# Patient Record
Sex: Female | Born: 1990 | Race: Black or African American | Hispanic: No | Marital: Single | Smoking: Never smoker
Health system: Southern US, Community
[De-identification: ages and names within clinical notes are randomized; demographics above are authoritative.]

## PROBLEM LIST (undated history)

## (undated) DIAGNOSIS — G932 Benign intracranial hypertension: Secondary | ICD-10-CM

## (undated) DIAGNOSIS — E559 Vitamin D deficiency, unspecified: Secondary | ICD-10-CM

## (undated) DIAGNOSIS — G250 Essential tremor: Secondary | ICD-10-CM

## (undated) DIAGNOSIS — K219 Gastro-esophageal reflux disease without esophagitis: Secondary | ICD-10-CM

## (undated) DIAGNOSIS — IMO0002 Reserved for concepts with insufficient information to code with codable children: Secondary | ICD-10-CM

## (undated) DIAGNOSIS — R42 Dizziness and giddiness: Secondary | ICD-10-CM

## (undated) DIAGNOSIS — J45909 Unspecified asthma, uncomplicated: Secondary | ICD-10-CM

## (undated) DIAGNOSIS — E739 Lactose intolerance, unspecified: Secondary | ICD-10-CM

## (undated) DIAGNOSIS — R251 Tremor, unspecified: Secondary | ICD-10-CM

## (undated) HISTORY — PX: TYMPANOSTOMY TUBE PLACEMENT: SHX32

## (undated) HISTORY — DX: Essential tremor: G25.0

## (undated) HISTORY — PX: TYMPANOPLASTY: SHX33

## (undated) HISTORY — DX: Tremor, unspecified: R25.1

## (undated) HISTORY — DX: Vitamin D deficiency, unspecified: E55.9

## (undated) HISTORY — DX: Benign intracranial hypertension: G93.2

## (undated) HISTORY — DX: Reserved for concepts with insufficient information to code with codable children: IMO0002

## (undated) HISTORY — DX: Dizziness and giddiness: R42

---
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
## 2010-07-17 NOTE — Op Note (Signed)
[place]. [facility]  Patient:    Michele Douglas, Michele Douglas                       MRN: [id number] Proc. Date: 09/22/00 Adm. Date:  09/22/00 Attending:  Molly Maduro L. Lyman Bishop, M.D.                           Operative Report  PREOPERATIVE DIAGNOSIS:  Central perforation left tympanic membrane.  POSTOPERATIVE DIAGNOSIS:  Central perforation left tympanic membrane.  PROCEDURE:  Left type 1 perichondrial cartilage tympanoplasty.  SURGEON:  Dr. Garrison Columbus.  ANESTHESIA:  General.  PROCEDURE IN DETAIL:  This patient has had childhood ear infections with tube myringotomies x 2 and had done well for several years following that with no further ear infections, and intact ear drums and 1 year prior to admission, after dying and swimming ruptured her ear drum on the left side which has been persistent.  Examination showed a large heart shaped type perforation involving mostly the posterior ear drum with no evidence of infection or cholesteatoma.   Audiogram showed a moderate conductive loss in the left ear, normal hearing in the right ear.  After satisfactory general endotracheal anesthesia had been induced 2% Xylocaine containing 1:50,000 epinephrine was infiltrated in the external ear canal and in the posterior external ear for hemostasis after which the ear and surrounding area were prepped with Betadine and sterile drapes applied. Examination showed a large heart shaped perforating involving most of the posterior half of the ear drum.  Well healed margins, but it was obvious that skin was turned inward especially inferiorly and posteriorly.  The squamous epithelium was carefully elevated with an angled pick removed with a cup forceps enlarging the margin of the perforation slightly in all directions. Pledgets of Surgicel saturated with topical epinephrine were placed in the middle ear space.  An incision was made over the posterior external ear and a circular piece of  perichondrium slightly larger than perforation with a small thin segment of cartilage attached in the center of the graft was taken and the incision closed with interrupted inverted 5-0 chromic to the subcutaneous tissues and running mild 6-0 chromic to the skin.  Steri-Strip dressing was applied.  Vertical ear canal incisions were then made at 12 and 6 oclock and connected posteriorly with a horizontal incision just medial to the bony cartilaginous junction and the posterior canal skin flap was elevated down to the ______ annulus which was elevated from the sulcus and the canal skin flap, and posterior rim of ear drum were reflected anteriorly.  The previously placed Gelfoam pledgets were removed.  The malleolus handle was slightly retracted but the ossicular chain was intact and mobile.  A thin piece of middle ear Silastic sheeting was placed over the premonitory and passed under the handle of the malleus and the middle ear was then packed with pledgets of Gelfoam saturated with Cortisporin suspension.  The graft was then placed over the defect, tucked under the margins of the drum, anteriorly, and inferiorly and under the handle of the malleolus superiorly and then allowed to extend onto the posterior canal wall.  The elevated canal skin flap posterior drum were then replaced in anatomic position over the graft.  Pledgets of Gelfoam saturatED with Cortisporin suspension were placed over the grafted area and then remainder of the canal was packed with a ______wick saturated with Cortisporin suspension.  Cotton impregnated with Cortisporin  was placed in the external meatus.  The patient tolerated the procedure well.  ESTIMATED BLOOD LOSS:  Less than 5 cc.  She was awakened from anesthesia and taken to recovery room in satisfactory condition. DD:  09/22/00 TD:  09/22/00 Job: [id number]

## 2010-07-17 NOTE — Op Note (Signed)
NAME:  Michele Douglas, Michele Douglas              ACCOUNT NO.:  [id number]   MEDICAL RECORD NO.:  [id number]          PATIENT TYPE:  AMB   LOCATION:  DSC                          FACILITY:  [facility]   PHYSICIAN:  Rose Phi. Maple Hudson, M.D.   DATE OF BIRTH:  1990/10/09   DATE OF PROCEDURE:  01/15/2005  DATE OF DISCHARGE:  01/15/2005                                 OPERATIVE REPORT   PREOPERATIVE DIAGNOSIS:  Fibroadenoma of the left breast.   POSTOPERATIVE DIAGNOSIS:  Fibroadenoma of the left breast.   OPERATION:  Excision of left breast mass.   SURGEON:  Rose Phi. Maple Hudson, M.D.   ANESTHESIA:  MAC.   OPERATIVE PROCEDURE:  The patient was placed on the operating room table  with arms extended on the armboard, and the left breast prepped and draped  in the usual fashion.  The palpable nodule was at the 1 o'clock position  close to the areolar margin.   After outlining it and a little short curved incision was outlined and the  area infiltrated with 1% Xylocaine with adrenaline, the incision was made,  and I exposed the fibroadenoma and then placed a suture in it for traction  purposes and then excised the fibroadenoma.  Hemostasis obtained with the  cautery.  Subcuticular closure of 4-0 Monocryl and Steri-Strips carried out.  Dressing applied.  The patient transferred to the recovery room in  satisfactory condition, having tolerated the procedure well.      Rose Phi. Maple Hudson, M.D.  Electronically Signed     PRY/MEDQ  D:  01/15/2005  T:  01/17/2005  Job:  [id number]

## 2011-07-15 ENCOUNTER — Other Ambulatory Visit: Payer: Self-pay | Admitting: Gastroenterology

## 2011-07-15 DIAGNOSIS — R1033 Periumbilical pain: Secondary | ICD-10-CM

## 2012-03-11 ENCOUNTER — Emergency Department (HOSPITAL_COMMUNITY): Payer: BC Managed Care – PPO

## 2012-03-11 ENCOUNTER — Emergency Department (HOSPITAL_COMMUNITY)
Admission: EM | Admit: 2012-03-11 | Discharge: 2012-03-11 | Disposition: A | Discharge status: Home / Self Care | Payer: BC Managed Care – PPO | Attending: Emergency Medicine | Admitting: Emergency Medicine

## 2012-03-11 ENCOUNTER — Encounter (HOSPITAL_COMMUNITY): Payer: Self-pay | Admitting: Emergency Medicine

## 2012-03-11 DIAGNOSIS — R110 Nausea: Secondary | ICD-10-CM | POA: Insufficient documentation

## 2012-03-11 DIAGNOSIS — Z3202 Encounter for pregnancy test, result negative: Secondary | ICD-10-CM | POA: Insufficient documentation

## 2012-03-11 DIAGNOSIS — R1084 Generalized abdominal pain: Secondary | ICD-10-CM | POA: Insufficient documentation

## 2012-03-11 DIAGNOSIS — Z862 Personal history of diseases of the blood and blood-forming organs and certain disorders involving the immune mechanism: Secondary | ICD-10-CM | POA: Insufficient documentation

## 2012-03-11 DIAGNOSIS — K219 Gastro-esophageal reflux disease without esophagitis: Secondary | ICD-10-CM | POA: Insufficient documentation

## 2012-03-11 DIAGNOSIS — Z8639 Personal history of other endocrine, nutritional and metabolic disease: Secondary | ICD-10-CM | POA: Insufficient documentation

## 2012-03-11 DIAGNOSIS — R109 Unspecified abdominal pain: Secondary | ICD-10-CM

## 2012-03-11 DIAGNOSIS — J45909 Unspecified asthma, uncomplicated: Secondary | ICD-10-CM | POA: Insufficient documentation

## 2012-03-11 HISTORY — DX: Unspecified asthma, uncomplicated: J45.909

## 2012-03-11 HISTORY — DX: Gastro-esophageal reflux disease without esophagitis: K21.9

## 2012-03-11 HISTORY — DX: Lactose intolerance, unspecified: E73.9

## 2012-03-11 LAB — URINALYSIS, ROUTINE W REFLEX MICROSCOPIC
Bilirubin Urine: NEGATIVE
Hgb urine dipstick: NEGATIVE
Ketones, ur: NEGATIVE mg/dL
Specific Gravity, Urine: 1.022 (ref 1.005–1.030)
Urobilinogen, UA: 0.2 mg/dL (ref 0.0–1.0)
pH: 6 (ref 5.0–8.0)

## 2012-03-11 LAB — CBC WITH DIFFERENTIAL/PLATELET
Eosinophils Relative: 2 % (ref 0–5)
HCT: 37.3 % (ref 36.0–46.0)
Lymphocytes Relative: 32 % (ref 12–46)
Lymphs Abs: 2.2 10*3/uL (ref 0.7–4.0)
MCH: 29.1 pg (ref 26.0–34.0)
MCV: 86.1 fL (ref 78.0–100.0)
Monocytes Absolute: 0.4 10*3/uL (ref 0.1–1.0)
Monocytes Relative: 6 % (ref 3–12)
RBC: 4.33 MIL/uL (ref 3.87–5.11)
WBC: 7 10*3/uL (ref 4.0–10.5)

## 2012-03-11 LAB — COMPREHENSIVE METABOLIC PANEL
ALT: 18 U/L (ref 0–35)
AST: 20 U/L (ref 0–37)
Albumin: 3.8 g/dL (ref 3.5–5.2)
Calcium: 9.1 mg/dL (ref 8.4–10.5)
Sodium: 136 mEq/L (ref 135–145)
Total Protein: 7.4 g/dL (ref 6.0–8.3)

## 2012-03-11 MED ORDER — SODIUM CHLORIDE 0.9 % IV SOLN
INTRAVENOUS | Status: DC
  Administered 2012-03-11: 09:00:00 via INTRAVENOUS

## 2012-03-11 MED ORDER — IOHEXOL 300 MG/ML  SOLN
100.0000 mL | Freq: Once | INTRAMUSCULAR | Status: AC | PRN
  Administered 2012-03-11: 100 mL via INTRAVENOUS

## 2012-03-11 MED ORDER — FAMOTIDINE IN NACL 20-0.9 MG/50ML-% IV SOLN
20.0000 mg | Freq: Once | INTRAVENOUS | Status: AC
  Administered 2012-03-11: 20 mg via INTRAVENOUS
  Filled 2012-03-11: qty 50

## 2012-03-11 MED ORDER — DICYCLOMINE HCL 20 MG PO TABS: 20.0000 mg | ORAL_TABLET | Freq: Four times a day (QID) | ORAL | Status: DC | PRN

## 2012-03-11 NOTE — ED Notes (Signed)
Pt presents w/ mid abdominal pain since 1 mos. Was seen at at [facility] student clinic and they thought it was perhaps her ovary or appendix, but did not have the equipment to scan. Pt is alert, nausea w/ emesis, states she feels like she gets swollen in her upper abdomen. Cannot remember the last time she had a normal BM

## 2012-03-11 NOTE — ED Provider Notes (Signed)
History     CSN: [id number]  Arrival date & time 03/11/12  4098   First MD Initiated Contact with Patient 03/11/12 [phone]      Chief Complaint  Patient presents with  . Abdominal Pain     HPI Pt was seen at 0815.   Per pt, c/o gradual onset and persistence of constant generalized abd "pain" for the past 1 month.  Describes the abd pain as "tingling" and "bloated."  Pt states she has been eval by [facility] and told she "needed a CT scan." Has been associated with nausea. Denies vomiting/diarrhea, no fevers, no back pain, no rash, no CP/SOB, no black or blood in stools or emesis, no dysuria/hematuria, no vaginal bleeding/discharge.       Past Medical History  Diagnosis Date  . GERD (gastroesophageal reflux disease)   . Lactose intolerance   . Asthma     Past Surgical History  Procedure Date  . Tympanoplasty     left x 2  . Breast surgery 2006    cystectomy    History  Substance Use Topics  . Smoking status: Never Smoker   . Smokeless tobacco: Never Used  . Alcohol Use: Yes     Comment: mixed drink ocassionally    Review of Systems ROS: Statement: All systems negative except as marked or noted in the HPI; Constitutional: Negative for fever and chills. ; ; Eyes: Negative for eye pain, redness and discharge. ; ; ENMT: Negative for ear pain, hoarseness, nasal congestion, sinus pressure and sore throat. ; ; Cardiovascular: Negative for chest pain, palpitations, diaphoresis, dyspnea and peripheral edema. ; ; Respiratory: Negative for cough, wheezing and stridor. ; ; Gastrointestinal: +abd pain, nausea. Negative for vomiting, diarrhea, blood in stool, hematemesis, jaundice and rectal bleeding. . ; ; Genitourinary: Negative for dysuria, flank pain and hematuria. ; ; GYN:  No vaginal bleeding, no vaginal discharge, no vulvar pain.;; Musculoskeletal: Negative for back pain and neck pain. Negative for swelling and trauma.; ; Skin: Negative for pruritus, rash, abrasions, blisters,  bruising and skin lesion.; ; Neuro: Negative for headache, lightheadedness and neck stiffness. Negative for weakness, altered level of consciousness , altered mental status, extremity weakness, paresthesias, involuntary movement, seizure and syncope.       Allergies  Ibuprofen  Home Medications   Current Outpatient Rx  Name  Route  Sig  Dispense  Refill  . BIOTIN 10 MG PO CAPS   Oral   Take 10 mg by mouth daily.         Marland Kitchen PROPRANOLOL HCL 10 MG PO TABS   Oral   Take 10 mg by mouth 2 (two) times daily.           BP 133/75  Pulse 96  Temp 98.4 F (36.9 C) (Oral)  Resp 18  SpO2 100%  LMP 03/03/2012  Physical Exam 0820: Physical examination:  Nursing notes reviewed; Vital signs and O2 SAT reviewed;  Constitutional: Well developed, Well nourished, Well hydrated, In no acute distress; Head:  Normocephalic, atraumatic; Eyes: EOMI, PERRL, No scleral icterus; ENMT: Mouth and pharynx normal, Mucous membranes moist; Neck: Supple, Full range of motion, No lymphadenopathy; Cardiovascular: Regular rate and rhythm, No murmur, rub, or gallop; Respiratory: Breath sounds clear & equal bilaterally, No rales, rhonchi, wheezes.  Speaking full sentences with ease, Normal respiratory effort/excursion; Chest: Nontender, Movement normal; Abdomen: Soft, +mild diffuse tenderness to palp. No rebound or guarding. Nondistended, Normal bowel sounds; Genitourinary: No CVA tenderness; Extremities: Pulses normal, No tenderness,  No edema, No calf edema or asymmetry.; Neuro: AA&Ox3, Major CN grossly intact.  Speech clear. Climbs on and off stretcher easily by herself. Gait steady. No gross focal motor or sensory deficits in extremities.; Skin: Color normal, Warm, Dry.   ED Course  Procedures     MDM  MDM Reviewed: nursing note, vitals and previous chart Interpretation: labs and CT scan   Results for orders placed during the hospital encounter of 03/11/12  URINALYSIS, ROUTINE W REFLEX MICROSCOPIC       Component Value Range   Color, Urine YELLOW  YELLOW   APPearance CLEAR  CLEAR   Specific Gravity, Urine 1.022  1.005 - 1.030   pH 6.0  5.0 - 8.0   Glucose, UA NEGATIVE  NEGATIVE mg/dL   Hgb urine dipstick NEGATIVE  NEGATIVE   Bilirubin Urine NEGATIVE  NEGATIVE   Ketones, ur NEGATIVE  NEGATIVE mg/dL   Protein, ur NEGATIVE  NEGATIVE mg/dL   Urobilinogen, UA 0.2  0.0 - 1.0 mg/dL   Nitrite NEGATIVE  NEGATIVE   Leukocytes, UA NEGATIVE  NEGATIVE  PREGNANCY, URINE      Component Value Range   Preg Test, Ur NEGATIVE  NEGATIVE  CBC WITH DIFFERENTIAL      Component Value Range   WBC 7.0  4.0 - 10.5 K/uL   RBC 4.33  3.87 - 5.11 MIL/uL   Hemoglobin 12.6  12.0 - 15.0 g/dL   HCT 78.2  95.6 - 21.3 %   MCV 86.1  78.0 - 100.0 fL   MCH 29.1  26.0 - 34.0 pg   MCHC 33.8  30.0 - 36.0 g/dL   RDW 08.6  57.8 - 46.9 %   Platelets 315  150 - 400 K/uL   Neutrophils Relative 59  43 - 77 %   Neutro Abs 4.2  1.7 - 7.7 K/uL   Lymphocytes Relative 32  12 - 46 %   Lymphs Abs 2.2  0.7 - 4.0 K/uL   Monocytes Relative 6  3 - 12 %   Monocytes Absolute 0.4  0.1 - 1.0 K/uL   Eosinophils Relative 2  0 - 5 %   Eosinophils Absolute 0.2  0.0 - 0.7 K/uL   Basophils Relative 1  0 - 1 %   Basophils Absolute 0.0  0.0 - 0.1 K/uL  COMPREHENSIVE METABOLIC PANEL      Component Value Range   Sodium 136  135 - 145 mEq/L   Potassium 3.6  3.5 - 5.1 mEq/L   Chloride 102  96 - 112 mEq/L   CO2 26  19 - 32 mEq/L   Glucose, Bld 96  70 - 99 mg/dL   BUN 11  6 - 23 mg/dL   Creatinine, Ser 6.29  0.50 - 1.10 mg/dL   Calcium 9.1  8.4 - 52.8 mg/dL   Total Protein 7.4  6.0 - 8.3 g/dL   Albumin 3.8  3.5 - 5.2 g/dL   AST 20  0 - 37 U/L   ALT 18  0 - 35 U/L   Alkaline Phosphatase 86  39 - 117 U/L   Total Bilirubin 0.5  0.3 - 1.2 mg/dL   GFR calc non Af Amer >90  >90 mL/min   GFR calc Af Amer >90  >90 mL/min  LIPASE, BLOOD      Component Value Range   Lipase 12  11 - 59 U/L   Ct Abdomen Pelvis W Contrast 03/11/2012  *RADIOLOGY  REPORT*  Clinical Data: Abdominal pain  CT  ABDOMEN AND PELVIS WITH CONTRAST  Technique:  Multidetector CT imaging of the abdomen and pelvis was performed following the standard protocol during bolus administration of intravenous contrast.  Contrast: OMNIPAQUE IOHEXOL 300 MG/ML  SOLN  Comparison: None  Findings: Lung bases:  The lung bases are clear.  There is no pericardial or pleural effusion.  Abdomen/pelvis:  No focal liver abnormality.  Gallbladder is normal.  No biliary dilatation.  The pancreas is unremarkable.  The spleen is normal.  Normal appearance of the adrenal glands.  The kidneys are unremarkable.  Urinary bladder is normal.  The uterus and adnexal structures are normal.  Normal caliber of the abdominal aorta.  No aneurysm identified.  No enlarged upper abdominal lymph nodes.  No pelvic or inguinal adenopathy.  There is no free fluid or abnormal fluid collections identified within the abdomen or pelvis.  The stomach and the small bowel loops appear within normal limits. The appendix is normal.  Normal appearance of the colon.  Bones/Musculoskeletal:  No abnormalities identified.  IMPRESSION:  1.  Normal exam.   Original Report Authenticated By: Signa Kell, M.D.       1025:  No acute findings on workup today.  Will refer to GI MD.  Dx and testing d/w pt.  Questions answered.  Verb understanding, agreeable to d/c home with outpt f/u.        Laray Anger, DO 03/13/12 1112

## 2012-04-17 DIAGNOSIS — R251 Tremor, unspecified: Secondary | ICD-10-CM | POA: Insufficient documentation

## 2013-02-07 ENCOUNTER — Telehealth: Payer: Self-pay | Admitting: Radiology

## 2013-02-07 ENCOUNTER — Ambulatory Visit (INDEPENDENT_AMBULATORY_CARE_PROVIDER_SITE_OTHER): Payer: BC Managed Care – PPO | Admitting: Family Medicine

## 2013-02-07 ENCOUNTER — Ambulatory Visit: Payer: BC Managed Care – PPO

## 2013-02-07 VITALS — BP 140/88 | HR 95 | Temp 98.1°F | Resp 16 | Ht 67.5 in | Wt 220.2 lb

## 2013-02-07 DIAGNOSIS — R0789 Other chest pain: Secondary | ICD-10-CM

## 2013-02-07 DIAGNOSIS — R10819 Abdominal tenderness, unspecified site: Secondary | ICD-10-CM

## 2013-02-07 DIAGNOSIS — R071 Chest pain on breathing: Secondary | ICD-10-CM

## 2013-02-07 DIAGNOSIS — R109 Unspecified abdominal pain: Secondary | ICD-10-CM

## 2013-02-07 DIAGNOSIS — Z79899 Other long term (current) drug therapy: Secondary | ICD-10-CM

## 2013-02-07 LAB — POCT UA - MICROSCOPIC ONLY: Mucus, UA: POSITIVE

## 2013-02-07 LAB — POCT URINALYSIS DIPSTICK
Glucose, UA: NEGATIVE
Ketones, UA: NEGATIVE
Spec Grav, UA: 1.025

## 2013-02-07 LAB — POCT CBC
Granulocyte percent: 64.8 %G (ref 37–80)
Lymph, poc: 2.2 (ref 0.6–3.4)
MCHC: 30.9 g/dL — AB (ref 31.8–35.4)
MCV: 94.7 fL (ref 80–97)
MID (cbc): 0.3 (ref 0–0.9)
POC Granulocyte: 4.6 (ref 2–6.9)
POC LYMPH PERCENT: 30.3 %L (ref 10–50)
Platelet Count, POC: 308 10*3/uL (ref 142–424)
RDW, POC: 12.7 %

## 2013-02-07 LAB — D-DIMER, QUANTITATIVE: D-Dimer, Quant: 0.28 ug/mL-FEU (ref 0.00–0.48)

## 2013-02-07 NOTE — Progress Notes (Addendum)
Subjective:    Patient ID: Michele Douglas, female    DOB: 08/03/1990, 22 y.o.   MRN: [id number] This chart was scribed for [name], MD by Nicholos Johns, Medical Scribe. This patient's care was started at 9:12 AM.  HPI HPI COMMENTS:  Michele Douglas is 22 y.o. female who presents to the office complaining of intermittent, sharp, stinging pain on the left flank/chest, onset 4 days, that has not occurred before. Inhalation brings on pain. She states last night she felt the same pain in her L shoulder as well. Last night she felt some neck and upper chest tightness and postnasal drip. Pt denies pain with palpation. Pt denies fever, SOB, trouble breathing, cough, trouble urinating, hematuria, rash, calf pain or calf swelling. Pt denies sick contacts, recent illness or injury, changes in exercise, recent travels, or long car rides. Pt has no hx of blood clots or kidney stones.  On propanolol for essential tremor.   On phentermine for weight loss. No palpitations.    There are no active problems to display for this patient.  Past Medical History  Diagnosis Date  . GERD (gastroesophageal reflux disease)   . Lactose intolerance   . Asthma    Past Surgical History  Procedure Laterality Date  . Tympanoplasty      left x 2  . Breast surgery  2006    cystectomy   Allergies  Allergen Reactions  . Ibuprofen Nausea And Vomiting   Prior to Admission medications   Medication Sig Start Date End Date Taking? Authorizing Provider  phentermine 37.5 MG capsule Take 37.5 mg by mouth every morning.   Yes Historical Provider, MD  propranolol (INDERAL) 10 MG tablet Take 10 mg by mouth 2 (two) times daily.   Yes Historical Provider, MD  Biotin 10 MG CAPS Take 10 mg by mouth daily.    Historical Provider, MD  dicyclomine (BENTYL) 20 MG tablet Take 1 tablet (20 mg total) by mouth every 6 (six) hours as needed (abdominal cramping). 03/11/12   Laray Anger, DO   History   Social History    . Marital Status: Single    Spouse Name: N/A    Number of Children: N/A  . Years of Education: N/A   Occupational History  . Not on file.   Social History Main Topics  . Smoking status: Never Smoker   . Smokeless tobacco: Never Used  . Alcohol Use: Yes     Comment: mixed drink ocassionally  . Drug Use: No  . Sexual Activity: No   Other Topics Concern  . Not on file   Social History Narrative  . No narrative on file      Review of Systems  Constitutional: Negative for fever.  HENT: Positive for postnasal drip.   Respiratory: Positive for chest tightness (Upper chest). Negative for cough and shortness of breath.   Cardiovascular: Negative for leg swelling (calf).  Genitourinary: Positive for flank pain. Negative for hematuria and difficulty urinating.  Skin: Negative for rash.       Objective:   Physical Exam  Vitals reviewed. Constitutional: She is oriented to person, place, and time. She appears well-developed and well-nourished. No distress.  HENT:  Head: Normocephalic and atraumatic.  Right Ear: Hearing, tympanic membrane, external ear and ear canal normal.  Left Ear: Hearing, tympanic membrane, external ear and ear canal normal.  Nose: Nose normal.  Mouth/Throat: Oropharynx is clear and moist. No oropharyngeal exudate.  Eyes: Conjunctivae and EOM are normal. Pupils are  equal, round, and reactive to light.  Cardiovascular: Normal rate, regular rhythm, normal heart sounds and intact distal pulses.   No murmur heard. Pulmonary/Chest: Effort normal and breath sounds normal. No respiratory distress. She has no wheezes. She has no rhonchi.  No retractions  Abdominal: Soft. She exhibits no distension. There is no tenderness.  Musculoskeletal:  Full ROM of L shoulder. Rotator cuff strength with L Shoulder.  Neurological: She is alert and oriented to person, place, and time.  Skin: Skin is warm and dry. No rash noted.  Psychiatric: She has a normal mood and affect.  Her behavior is normal.    Filed Vitals:   02/07/13 0836  BP: 140/88  Pulse: 95  Temp: 98.1 F (36.7 C)  TempSrc: Oral  Resp: 16  Height: 5' 7.5" (1.715 m)  Weight: 220 lb 3.2 oz (99.882 kg)  SpO2: 100%   Results for orders placed in visit on 02/07/13  POCT URINALYSIS DIPSTICK      Result Value Range   Color, UA yellow     Clarity, UA clear     Glucose, UA neg     Bilirubin, UA neg     Ketones, UA neg     Spec Grav, UA 1.025     Blood, UA neg     pH, UA 6.0     Protein, UA neg     Urobilinogen, UA 0.2     Nitrite, UA neg     Leukocytes, UA Negative    POCT UA - MICROSCOPIC ONLY      Result Value Range   WBC, Ur, HPF, POC 1-2     RBC, urine, microscopic 1-2     Bacteria, U Microscopic 1+     Mucus, UA pos     Epithelial cells, urine per micros 1-2     Crystals, Ur, HPF, POC neg     Casts, Ur, LPF, POC neg     Yeast, UA neg    POCT CBC      Result Value Range   WBC 7.1  4.6 - 10.2 K/uL   Lymph, poc 2.2  0.6 - 3.4   POC LYMPH PERCENT 30.3  10 - 50 %L   MID (cbc) 0.3  0 - 0.9   POC MID % 4.9  0 - 12 %M   POC Granulocyte 4.6  2 - 6.9   Granulocyte percent 64.8  37 - 80 %G   RBC 4.61  4.04 - 5.48 M/uL   Hemoglobin 13.5  12.2 - 16.2 g/dL   HCT, POC 16.1  09.6 - 47.9 %   MCV 94.7  80 - 97 fL   MCH, POC 29.3  27 - 31.2 pg   MCHC 30.9 (*) 31.8 - 35.4 g/dL   RDW, POC 04.5     Platelet Count, POC 308  142 - 424 K/uL   MPV 9.2  0 - 99.8 fL   [facility] reading (PRIMARY) by  Dr. Neva Seat: CXR: NAD.   EKG: sr, nonspecific ST precordial and II, no acute findings.        Assessment & Plan:   Michele Douglas is a 22 y.o. female Chest wall pain - Plan: DG Chest 2 View, EKG 12-Lead, D-dimer, quantitative, POCT CBC, POCT SEDIMENTATION RATE  Left flank pain - Plan: POCT urinalysis dipstick, POCT UA - Microscopic Only, POCT CBC, POCT SEDIMENTATION RATE  High risk medication use - Plan: EKG 12-Lead  Suspected chest wall pain/costochondritis and underlying URi vs ar with PND  -  can try saline ns, allegra or zyrtec, and will check ESR for unlikely pericarditis, D Dimer - but no know PE/DVT risk factors. ER/rtc precautions discussed.   Meds ordered this encounter  Medications  . phentermine 37.5 MG capsule    Sig: Take 37.5 mg by mouth every morning.   Patient Instructions  Your chest wall pain may be costochondritis, or pain in the muscles or bone and cartilage areas of chest. You can try heat over these areas, tylenol as you cannot take ibuprofen, and we will check other tests for blood clots or inflammation around heart  but this is unlikely. Return to the clinic or go to the nearest emergency room if any of your symptoms worsen or new symptoms occur.  Chest Pain (Nonspecific) It is often hard to give a specific diagnosis for the cause of chest pain. There is always a chance that your pain could be related to something serious, such as a heart attack or a blood clot in the lungs. You need to follow up with your caregiver for further evaluation. CAUSES   Heartburn.  Pneumonia or bronchitis.  Anxiety or stress.  Inflammation around your heart (pericarditis) or lung (pleuritis or pleurisy).  A blood clot in the lung.  A collapsed lung (pneumothorax). It can develop suddenly on its own (spontaneous pneumothorax) or from injury (trauma) to the chest.  Shingles infection (herpes zoster virus). The chest wall is composed of bones, muscles, and cartilage. Any of these can be the source of the pain.  The bones can be bruised by injury.  The muscles or cartilage can be strained by coughing or overwork.  The cartilage can be affected by inflammation and become sore (costochondritis). DIAGNOSIS  Lab tests or other studies, such as X-rays, electrocardiography, stress testing, or cardiac imaging, may be needed to find the cause of your pain.  TREATMENT   Treatment depends on what may be causing your chest pain. Treatment may include:  Acid blockers for  heartburn.  Anti-inflammatory medicine.  Pain medicine for inflammatory conditions.  Antibiotics if an infection is present.  You may be advised to change lifestyle habits. This includes stopping smoking and avoiding alcohol, caffeine, and chocolate.  You may be advised to keep your head raised (elevated) when sleeping. This reduces the chance of acid going backward from your stomach into your esophagus.  Most of the time, nonspecific chest pain will improve within 2 to 3 days with rest and mild pain medicine. HOME CARE INSTRUCTIONS   If antibiotics were prescribed, take your antibiotics as directed. Finish them even if you start to feel better.  For the next few days, avoid physical activities that bring on chest pain. Continue physical activities as directed.  Do not smoke.  Avoid drinking alcohol.  Only take over-the-counter or prescription medicine for pain, discomfort, or fever as directed by your caregiver.  Follow your caregiver's suggestions for further testing if your chest pain does not go away.  Keep any follow-up appointments you made. If you do not go to an appointment, you could develop lasting (chronic) problems with pain. If there is any problem keeping an appointment, you must call to reschedule. SEEK MEDICAL CARE IF:   You think you are having problems from the medicine you are taking. Read your medicine instructions carefully.  Your chest pain does not go away, even after treatment.  You develop a rash with blisters on your chest. SEEK IMMEDIATE MEDICAL CARE IF:   You have increased chest pain or  pain that spreads to your arm, neck, jaw, back, or abdomen.  You develop shortness of breath, an increasing cough, or you are coughing up blood.  You have severe back or abdominal pain, feel nauseous, or vomit.  You develop severe weakness, fainting, or chills.  You have a fever. THIS IS AN EMERGENCY. Do not wait to see if the pain will go away. Get medical  help at once. Call your local emergency services (911 in [place] Do not drive yourself to the hospital. MAKE SURE YOU:   Understand these instructions.  Will watch your condition.  Will get help right away if you are not doing well or get worse. Document Released: 11/25/2004 Document Revised: 05/10/2011 Document Reviewed: 09/21/2007 [facility] Patient Information 2014 [place], [place].

## 2013-02-07 NOTE — Patient Instructions (Addendum)
Your chest wall pain may be costochondritis, or pain in the muscles or bone and cartilage areas of chest. You can try heat over these areas, tylenol as you cannot take ibuprofen, and we will check other tests for blood clots or inflammation around heart  but this is unlikely. Return to the clinic or go to the nearest emergency room if any of your symptoms worsen or new symptoms occur.  Chest Pain (Nonspecific) It is often hard to give a specific diagnosis for the cause of chest pain. There is always a chance that your pain could be related to something serious, such as a heart attack or a blood clot in the lungs. You need to follow up with your caregiver for further evaluation. CAUSES   Heartburn.  Pneumonia or bronchitis.  Anxiety or stress.  Inflammation around your heart (pericarditis) or lung (pleuritis or pleurisy).  A blood clot in the lung.  A collapsed lung (pneumothorax). It can develop suddenly on its own (spontaneous pneumothorax) or from injury (trauma) to the chest.  Shingles infection (herpes zoster virus). The chest wall is composed of bones, muscles, and cartilage. Any of these can be the source of the pain.  The bones can be bruised by injury.  The muscles or cartilage can be strained by coughing or overwork.  The cartilage can be affected by inflammation and become sore (costochondritis). DIAGNOSIS  Lab tests or other studies, such as X-rays, electrocardiography, stress testing, or cardiac imaging, may be needed to find the cause of your pain.  TREATMENT   Treatment depends on what may be causing your chest pain. Treatment may include:  Acid blockers for heartburn.  Anti-inflammatory medicine.  Pain medicine for inflammatory conditions.  Antibiotics if an infection is present.  You may be advised to change lifestyle habits. This includes stopping smoking and avoiding alcohol, caffeine, and chocolate.  You may be advised to keep your head raised (elevated)  when sleeping. This reduces the chance of acid going backward from your stomach into your esophagus.  Most of the time, nonspecific chest pain will improve within 2 to 3 days with rest and mild pain medicine. HOME CARE INSTRUCTIONS   If antibiotics were prescribed, take your antibiotics as directed. Finish them even if you start to feel better.  For the next few days, avoid physical activities that bring on chest pain. Continue physical activities as directed.  Do not smoke.  Avoid drinking alcohol.  Only take over-the-counter or prescription medicine for pain, discomfort, or fever as directed by your caregiver.  Follow your caregiver's suggestions for further testing if your chest pain does not go away.  Keep any follow-up appointments you made. If you do not go to an appointment, you could develop lasting (chronic) problems with pain. If there is any problem keeping an appointment, you must call to reschedule. SEEK MEDICAL CARE IF:   You think you are having problems from the medicine you are taking. Read your medicine instructions carefully.  Your chest pain does not go away, even after treatment.  You develop a rash with blisters on your chest. SEEK IMMEDIATE MEDICAL CARE IF:   You have increased chest pain or pain that spreads to your arm, neck, jaw, back, or abdomen.  You develop shortness of breath, an increasing cough, or you are coughing up blood.  You have severe back or abdominal pain, feel nauseous, or vomit.  You develop severe weakness, fainting, or chills.  You have a fever. THIS IS AN EMERGENCY. Do  not wait to see if the pain will go away. Get medical help at once. Call your local emergency services (911 in [place] Do not drive yourself to the hospital. MAKE SURE YOU:   Understand these instructions.  Will watch your condition.  Will get help right away if you are not doing well or get worse. Document Released: 11/25/2004 Document Revised: 05/10/2011  Document Reviewed: 09/21/2007 [facility] Patient Information 2014 [place], [place].

## 2013-02-07 NOTE — Telephone Encounter (Signed)
D dimer negative 0.28

## 2013-07-26 ENCOUNTER — Other Ambulatory Visit (HOSPITAL_COMMUNITY)
Admission: RE | Admit: 2013-07-26 | Discharge: 2013-07-26 | Disposition: A | Discharge status: Home / Self Care | Payer: No Typology Code available for payment source | Source: Ambulatory Visit | Attending: Gynecology | Admitting: Gynecology

## 2013-07-26 ENCOUNTER — Encounter: Payer: Self-pay | Admitting: Gynecology

## 2013-07-26 ENCOUNTER — Ambulatory Visit (INDEPENDENT_AMBULATORY_CARE_PROVIDER_SITE_OTHER): Payer: No Typology Code available for payment source | Admitting: Gynecology

## 2013-07-26 VITALS — BP 124/80 | Ht 67.0 in | Wt 250.0 lb

## 2013-07-26 DIAGNOSIS — R221 Localized swelling, mass and lump, neck: Secondary | ICD-10-CM

## 2013-07-26 DIAGNOSIS — N393 Stress incontinence (female) (male): Secondary | ICD-10-CM

## 2013-07-26 DIAGNOSIS — Z01419 Encounter for gynecological examination (general) (routine) without abnormal findings: Secondary | ICD-10-CM | POA: Insufficient documentation

## 2013-07-26 DIAGNOSIS — Z113 Encounter for screening for infections with a predominantly sexual mode of transmission: Secondary | ICD-10-CM

## 2013-07-26 DIAGNOSIS — R220 Localized swelling, mass and lump, head: Secondary | ICD-10-CM

## 2013-07-26 NOTE — Patient Instructions (Signed)
Followup in one year for annual exam. Followup if the lump in your neck gets larger and states larger or other lumps appear. Followup if you become sexually active to discuss contraception.  You may obtain a copy of any labs that were done today by logging onto MyChart as outlined in the instructions provided with your AVS (after visit summary). The office will not call with normal lab results but certainly if there are any significant abnormalities then we will contact you.   Health Maintenance, Female A healthy lifestyle and preventative care can promote health and wellness.  Maintain regular health, dental, and eye exams.  Eat a healthy diet. Foods like vegetables, fruits, whole grains, low-fat dairy products, and lean protein foods contain the nutrients you need without too many calories. Decrease your intake of foods high in solid fats, added sugars, and salt. Get information about a proper diet from your caregiver, if necessary.  Regular physical exercise is one of the most important things you can do for your health. Most adults should get at least 150 minutes of moderate-intensity exercise (any activity that increases your heart rate and causes you to sweat) each week. In addition, most adults need muscle-strengthening exercises on 2 or more days a week.   Maintain a healthy weight. The body mass index (BMI) is a screening tool to identify possible weight problems. It provides an estimate of body fat based on height and weight. Your caregiver can help determine your BMI, and can help you achieve or maintain a healthy weight. For adults 20 years and older:  A BMI below 18.5 is considered underweight.  A BMI of 18.5 to 24.9 is normal.  A BMI of 25 to 29.9 is considered overweight.  A BMI of 30 and above is considered obese.  Maintain normal blood lipids and cholesterol by exercising and minimizing your intake of saturated fat. Eat a balanced diet with plenty of fruits and vegetables.  Blood tests for lipids and cholesterol should begin at age 38 and be repeated every 5 years. If your lipid or cholesterol levels are high, you are over 50, or you are a high risk for heart disease, you may need your cholesterol levels checked more frequently.Ongoing high lipid and cholesterol levels should be treated with medicines if diet and exercise are not effective.  If you smoke, find out from your caregiver how to quit. If you do not use tobacco, do not start.  Lung cancer screening is recommended for adults aged 96 80 years who are at high risk for developing lung cancer because of a history of smoking. Yearly low-dose computed tomography (CT) is recommended for people who have at least a 30-pack-year history of smoking and are a current smoker or have quit within the past 15 years. A pack year of smoking is smoking an average of 1 pack of cigarettes a day for 1 year (for example: 1 pack a day for 30 years or 2 packs a day for 15 years). Yearly screening should continue until the smoker has stopped smoking for at least 15 years. Yearly screening should also be stopped for people who develop a health problem that would prevent them from having lung cancer treatment.  If you are pregnant, do not drink alcohol. If you are breastfeeding, be very cautious about drinking alcohol. If you are not pregnant and choose to drink alcohol, do not exceed 1 drink per day. One drink is considered to be 12 ounces (355 mL) of beer, 5 ounces (148  mL) of wine, or 1.5 ounces (44 mL) of liquor.  Avoid use of street drugs. Do not share needles with anyone. Ask for help if you need support or instructions about stopping the use of drugs.  High blood pressure causes heart disease and increases the risk of stroke. Blood pressure should be checked at least every 1 to 2 years. Ongoing high blood pressure should be treated with medicines, if weight loss and exercise are not effective.  If you are 40 to 24 years old, ask your  caregiver if you should take aspirin to prevent strokes.  Diabetes screening involves taking a blood sample to check your fasting blood sugar level. This should be done once every 3 years, after age 33, if you are within normal weight and without risk factors for diabetes. Testing should be considered at a younger age or be carried out more frequently if you are overweight and have at least 1 risk factor for diabetes.  Breast cancer screening is essential preventative care for women. You should practice "breast self-awareness." This means understanding the normal appearance and feel of your breasts and may include breast self-examination. Any changes detected, no matter how small, should be reported to a caregiver. Women in their 45s and 30s should have a clinical breast exam (CBE) by a caregiver as part of a regular health exam every 1 to 3 years. After age 38, women should have a CBE every year. Starting at age 6, women should consider having a mammogram (breast X-ray) every year. Women who have a family history of breast cancer should talk to their caregiver about genetic screening. Women at a high risk of breast cancer should talk to their caregiver about having an MRI and a mammogram every year.  Breast cancer gene (BRCA)-related cancer risk assessment is recommended for women who have family members with BRCA-related cancers. BRCA-related cancers include breast, ovarian, tubal, and peritoneal cancers. Having family members with these cancers may be associated with an increased risk for harmful changes (mutations) in the breast cancer genes BRCA1 and BRCA2. Results of the assessment will determine the need for genetic counseling and BRCA1 and BRCA2 testing.  The Pap test is a screening test for cervical cancer. Women should have a Pap test starting at age 41. Between ages 53 and 38, Pap tests should be repeated every 2 years. Beginning at age 20, you should have a Pap test every 3 years as long as the  past 3 Pap tests have been normal. If you had a hysterectomy for a problem that was not cancer or a condition that could lead to cancer, then you no longer need Pap tests. If you are between ages 89 and 2, and you have had normal Pap tests going back 10 years, you no longer need Pap tests. If you have had past treatment for cervical cancer or a condition that could lead to cancer, you need Pap tests and screening for cancer for at least 20 years after your treatment. If Pap tests have been discontinued, risk factors (such as a new sexual partner) need to be reassessed to determine if screening should be resumed. Some women have medical problems that increase the chance of getting cervical cancer. In these cases, your caregiver may recommend more frequent screening and Pap tests.  The human papillomavirus (HPV) test is an additional test that may be used for cervical cancer screening. The HPV test looks for the virus that can cause the cell changes on the cervix. The cells  collected during the Pap test can be tested for HPV. The HPV test could be used to screen women aged 51 years and older, and should be used in women of any age who have unclear Pap test results. After the age of 29, women should have HPV testing at the same frequency as a Pap test.  Colorectal cancer can be detected and often prevented. Most routine colorectal cancer screening begins at the age of 10 and continues through age 68. However, your caregiver may recommend screening at an earlier age if you have risk factors for colon cancer. On a yearly basis, your caregiver may provide home test kits to check for hidden blood in the stool. Use of a small camera at the end of a tube, to directly examine the colon (sigmoidoscopy or colonoscopy), can detect the earliest forms of colorectal cancer. Talk to your caregiver about this at age 3, when routine screening begins. Direct examination of the colon should be repeated every 5 to 10 years through  age 27, unless early forms of pre-cancerous polyps or small growths are found.  Hepatitis C blood testing is recommended for all people born from [birth year] through 1966 and any individual with known risks for hepatitis C.  Practice safe sex. Use condoms and avoid high-risk sexual practices to reduce the spread of sexually transmitted infections (STIs). Sexually active women aged 42 and younger should be checked for Chlamydia, which is a common sexually transmitted infection. Older women with new or multiple partners should also be tested for Chlamydia. Testing for other STIs is recommended if you are sexually active and at increased risk.  Osteoporosis is a disease in which the bones lose minerals and strength with aging. This can result in serious bone fractures. The risk of osteoporosis can be identified using a bone density scan. Women ages 8 and over and women at risk for fractures or osteoporosis should discuss screening with their caregivers. Ask your caregiver whether you should be taking a calcium supplement or vitamin D to reduce the rate of osteoporosis.  Menopause can be associated with physical symptoms and risks. Hormone replacement therapy is available to decrease symptoms and risks. You should talk to your caregiver about whether hormone replacement therapy is right for you.  Use sunscreen. Apply sunscreen liberally and repeatedly throughout the day. You should seek shade when your shadow is shorter than you. Protect yourself by wearing long sleeves, pants, a wide-brimmed hat, and sunglasses year round, whenever you are outdoors.  Notify your caregiver of new moles or changes in moles, especially if there is a change in shape or color. Also notify your caregiver if a mole is larger than the size of a pencil eraser.  Stay current with your immunizations. Document Released: 08/31/2010 Document Revised: 06/12/2012 Document Reviewed: 08/31/2010 [facility] Patient Information 2014 [place].

## 2013-07-26 NOTE — Addendum Note (Signed)
Addended by: Dayna Barker on: 07/26/2013 10:12 AM   Modules accepted: Orders

## 2013-07-26 NOTE — Progress Notes (Addendum)
Patient ID: Michele Douglas, female   DOB: 02-18-1991, 23 y.o.   MRN: [id number] Michele Douglas [birth year] [id number]        23 y.o.  G0P0 for first annual exam.  Having no issues.  Past medical history,surgical history, problem list, medications, allergies, family history and social history were all reviewed and documented as reviewed in the EPIC chart.  ROS:  12 system ROS performed with pertinent positives and negatives included in the history, assessment and plan.  Included Systems: General, HEENT, Neck, Cardiovascular, Pulmonary, Gastrointestinal, Genitourinary, Musculoskeletal, Dermatologic, Endocrine, Hematological, Neurologic, Psychiatric Additional significant findings :  #1 Lump in right neck as described below. #2 urinary incontinence   Exam: Kim assistant Filed Vitals:   07/26/13 0852  BP: 124/80  Height: 5\' 7"  (1.702 m)  Weight: 250 lb (113.399 kg)   General appearance:  Normal affect, orientation and appearance. Skin: Grossly normal HEENT: Small 7 mm mobile subcutaneous nodule behind right earlobe. Freely mobile with no overlying skin changes. No other neck masses or abnormalities. Thyroid normal.  Physical Exam  HENT:  Head:      Lungs:  Clear without wheezing, rales or rhonchi Cardiac: RR, without RMG Abdominal:  Soft, nontender, without masses, guarding, rebound, organomegaly or hernia Breasts:  Examined lying and sitting without masses, retractions, discharge or axillary adenopathy. Pelvic:  Ext/BUS/vagina normal  Cervix normal. Pap, GC/Chlamydia  Uterus anteverted, normal size, shape and contour, midline and mobile nontender   Adnexa  Without masses or tenderness    Anus and perineum  Normal    without palpated masses or tenderness.    Assessment/Plan:  [name] female for first annual exam, regular menses, not sexually active.   1. Right neck lump. Consistent with small lymph node versus sebaceous cyst. Patient does note that it sometimes will get  larger and then smaller. Has been present for a long time. No other palpable abnormalities. Differential reviewed with the patient up to and including cancer. Given the stability and the fluctuation in size suspect either a lymph node or sebaceous cyst. Options including excision versus observation reviewed. Patient's comfortable with observation at this time. Will followup if it consistently gets larger or tender or other masses present themselves. 2. Urinary incontinence. Patient notes over the past several years some episodes of loss of urine particularly when her bladder is overfilled. No urgency dysuria or other symptoms. No issues with bowel movements. Usually provoked with stressful movements like coughing sneezing. Reviewed issues of stress incontinence with her and recommended more consistent voiding, avoiding caffeine and carbonated beverages. Check urinalysis today. If worsens then will refer to urology. 3. Contraception. Patient is not currently sexually active although has been in the past. Options for contraception reviewed declined. Patient does not plan on becoming sexually active anytime soon. Will followup if she decides to do so for contraceptive counseling. 4. STD screening. GC/Chlamydia screen done given her past history of sexual activity. Need for condoms regardless it does become sexually active. 5. Pap smear. First Pap smear done today. Plan 3 year repeat interval is normal. 6. Breast health. SBE monthly reviewed. 7. Gardasil series reportedly received x3. Patient cannot remember the exact date/age.   Note: This document was prepared with digital dictation and possible smart phrase technology. Any transcriptional errors that result from this process are unintentional.   [name] MD, 9:37 AM 07/26/2013

## 2013-07-27 LAB — URINALYSIS W MICROSCOPIC + REFLEX CULTURE
Bacteria, UA: NONE SEEN
Bilirubin Urine: NEGATIVE
Casts: NONE SEEN
Crystals: NONE SEEN
Glucose, UA: NEGATIVE mg/dL
HGB URINE DIPSTICK: NEGATIVE
Ketones, ur: NEGATIVE mg/dL
LEUKOCYTES UA: NEGATIVE
NITRITE: NEGATIVE
PROTEIN: NEGATIVE mg/dL
Specific Gravity, Urine: 1.012 (ref 1.005–1.030)
Squamous Epithelial / LPF: NONE SEEN
Urobilinogen, UA: 0.2 mg/dL (ref 0.0–1.0)
pH: 5.5 (ref 5.0–8.0)

## 2013-07-27 LAB — GC/CHLAMYDIA PROBE AMP
CT Probe RNA: NEGATIVE
GC PROBE AMP APTIMA: NEGATIVE

## 2013-07-30 DIAGNOSIS — IMO0002 Reserved for concepts with insufficient information to code with codable children: Secondary | ICD-10-CM | POA: Insufficient documentation

## 2013-07-30 HISTORY — DX: Reserved for concepts with insufficient information to code with codable children: IMO0002

## 2013-08-01 ENCOUNTER — Encounter: Payer: Self-pay | Admitting: Gynecology

## 2013-08-29 ENCOUNTER — Ambulatory Visit: Payer: No Typology Code available for payment source | Admitting: Gynecology

## 2013-09-18 ENCOUNTER — Encounter: Payer: Self-pay | Admitting: Gynecology

## 2013-09-18 ENCOUNTER — Ambulatory Visit (INDEPENDENT_AMBULATORY_CARE_PROVIDER_SITE_OTHER): Payer: No Typology Code available for payment source | Admitting: Gynecology

## 2013-09-18 DIAGNOSIS — R6889 Other general symptoms and signs: Secondary | ICD-10-CM

## 2013-09-18 DIAGNOSIS — IMO0002 Reserved for concepts with insufficient information to code with codable children: Secondary | ICD-10-CM

## 2013-09-18 NOTE — Progress Notes (Signed)
Patient ID: Michele Douglas, female   DOB: 10-04-1990, 23 y.o.   MRN: [id number] Michele Douglas 10-04-1990 [id number]        23 y.o.  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  Past medical history,surgical history, problem list, medications, allergies, family history and social history were all reviewed and documented in the EPIC chart.  Directed ROS with pertinent positives and negatives documented in the history of present illness/assessment and plan.  Exam: Michele Douglas assistant General appearance:  Normal Pelvic: External BUS vagina normal. Cervix grossly normal.  Colposcopy after acetic acid cleanse adequate with acetowhite area 8:00 transformation zone. Representative biopsy taken. ECC performed. Physical Exam  Genitourinary:       Assessment/Plan:  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 Colposcopy with acetowhite change 4:00 transformation zone. Biopsy/ECC performed. I reviewed with the patient the whole issue of dysplasia high grade/low grade, progression/regression and the HPV association. Will await biopsy results. If low-grade/normal then plan expectant management with repeat Pap smear in one year. If otherwise then we'll triaged based on results.   Note: This document was prepared with digital dictation and possible smart phrase technology. Any transcriptional errors that result from this process are unintentional.   Michele Douglas P MD, 12:19 PM 09/18/2013

## 2013-09-18 NOTE — Patient Instructions (Signed)
Colposcopy Care After Colposcopy is a procedure in which a special tool is used to magnify the surface of the cervix. A tissue sample (biopsy) may also be taken. This sample will be looked at for cervical cancer or other problems. After the test:  You may have some cramping.  Lie down for a few minutes if you feel lightheaded.   You may have some bleeding which should stop in a few days. HOME CARE  Do not have sex or use tampons for 2 to 3 days or as told.  Only take medicine as told by your doctor.  Continue to take your birth control pills as usual. Finding out the results of your test Ask when your test results will be ready. Make sure you get your test results. GET HELP RIGHT AWAY IF:  You are bleeding a lot or are passing blood clots.  You develop a fever of 102 F (38.9 C) or higher.  You have abnormal vaginal discharge.  You have cramps that do not go away with medicine.  You feel lightheaded, dizzy, or pass out (faint). MAKE SURE YOU:   Understand these instructions.  Will watch your condition.  Will get help right away if you are not doing well or get worse. Document Released: 08/04/2007 Document Revised: 05/10/2011 Document Reviewed: 09/14/2012 [employer] Patient Information 2015 [employer]. This information is not intended to replace advice given to you by your health care provider. Make sure you discuss any questions you have with your health care provider.  

## 2013-09-20 ENCOUNTER — Encounter: Payer: Self-pay | Admitting: Gynecology

## 2013-11-20 ENCOUNTER — Ambulatory Visit (INDEPENDENT_AMBULATORY_CARE_PROVIDER_SITE_OTHER): Payer: No Typology Code available for payment source | Admitting: Family Medicine

## 2013-11-20 ENCOUNTER — Ambulatory Visit (INDEPENDENT_AMBULATORY_CARE_PROVIDER_SITE_OTHER): Payer: No Typology Code available for payment source

## 2013-11-20 VITALS — BP 124/84 | HR 83 | Temp 97.5°F | Resp 16 | Ht 66.0 in | Wt 264.0 lb

## 2013-11-20 DIAGNOSIS — M25511 Pain in right shoulder: Secondary | ICD-10-CM

## 2013-11-20 DIAGNOSIS — M949 Disorder of cartilage, unspecified: Secondary | ICD-10-CM

## 2013-11-20 DIAGNOSIS — M899 Disorder of bone, unspecified: Secondary | ICD-10-CM

## 2013-11-20 MED ORDER — PREDNISONE 20 MG PO TABS
40.0000 mg | ORAL_TABLET | Freq: Every day | ORAL | Status: DC
Start: 2013-11-20 — End: 2014-04-02

## 2013-11-20 NOTE — Progress Notes (Signed)
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 She had a bottle of condition or fall on her right clavicle when she was in the shower. This happened 2 days ago and she's had persistent pain now with some pain at the right manubrial border.  She's had no other injuries. She has no shortness of breath or deep chest pain.  Patient's tried ice, heat, and over-the-counter nonsteroidal agents with no relief.  Objective: No acute distress Lungs are clear to auscultation Patient has exquisite tenderness over the mid clavicle as well as the right manubrial border. Chest pain when raising her right arm but she has no muscle wasting or weakness there.  [facility] reading (PRIMARY) by  Dr. Milus Glazier: Negative chest and right clavicle  Assessment: Bone bruise of the right clavicle without evidence of underlying injury  Plan:Clavicle pain, right - Plan: DG Clavicle Right, DG Chest 2 View, predniSONE (DELTASONE) 20 MG tablet  Signed, Elvina Sidle, MD  . :

## 2013-12-14 ENCOUNTER — Other Ambulatory Visit: Payer: Self-pay

## 2014-04-02 ENCOUNTER — Ambulatory Visit (INDEPENDENT_AMBULATORY_CARE_PROVIDER_SITE_OTHER): Payer: 59 | Admitting: Family Medicine

## 2014-04-02 VITALS — BP 128/92 | HR 73 | Temp 98.2°F | Resp 18 | Ht 66.75 in | Wt 267.2 lb

## 2014-04-02 DIAGNOSIS — H9201 Otalgia, right ear: Secondary | ICD-10-CM

## 2014-04-02 NOTE — Patient Instructions (Signed)
Go to scheduled appt with Dr. Annalee Genta today.  Return to the clinic or go to the nearest emergency room if any of your symptoms worsen or new symptoms occur.  Otalgia The most common reason for this in children is an infection of the middle ear. Pain from the middle ear is usually caused by a build-up of fluid and pressure behind the eardrum. Pain from an earache can be sharp, dull, or burning. The pain may be temporary or constant. The middle ear is connected to the nasal passages by a short narrow tube called the Eustachian tube. The Eustachian tube allows fluid to drain out of the middle ear, and helps keep the pressure in your ear equalized. CAUSES  A cold or allergy can block the Eustachian tube with inflammation and the build-up of secretions. This is especially likely in small children, because their Eustachian tube is shorter and more horizontal. When the Eustachian tube closes, the normal flow of fluid from the middle ear is stopped. Fluid can accumulate and cause stuffiness, pain, hearing loss, and an ear infection if germs start growing in this area. SYMPTOMS  The symptoms of an ear infection may include fever, ear pain, fussiness, increased crying, and irritability. Many children will have temporary and minor hearing loss during and right after an ear infection. Permanent hearing loss is rare, but the risk increases the more infections a child has. Other causes of ear pain include retained water in the outer ear canal from swimming and bathing. Ear pain in adults is less likely to be from an ear infection. Ear pain may be referred from other locations. Referred pain may be from the joint between your jaw and the skull. It may also come from a tooth problem or problems in the neck. Other causes of ear pain include:  A foreign body in the ear.  Outer ear infection.  Sinus infections.  Impacted ear wax.  Ear injury.  Arthritis of the jaw or TMJ problems.  Middle ear  infection.  Tooth infections.  Sore throat with pain to the ears. DIAGNOSIS  Your caregiver can usually make the diagnosis by examining you. Sometimes other special studies, including x-rays and lab work may be necessary. TREATMENT   If antibiotics were prescribed, use them as directed and finish them even if you or your child's symptoms seem to be improved.  Sometimes PE tubes are needed in children. These are little plastic tubes which are put into the eardrum during a simple surgical procedure. They allow fluid to drain easier and allow the pressure in the middle ear to equalize. This helps relieve the ear pain caused by pressure changes. HOME CARE INSTRUCTIONS   Only take over-the-counter or prescription medicines for pain, discomfort, or fever as directed by your caregiver. DO NOT GIVE CHILDREN ASPIRIN because of the association of Reye's Syndrome in children taking aspirin.  Use a cold pack applied to the outer ear for 15-20 minutes, 03-04 times per day or as needed may reduce pain. Do not apply ice directly to the skin. You may cause frost bite.  Over-the-counter ear drops used as directed may be effective. Your caregiver may sometimes prescribe ear drops.  Resting in an upright position may help reduce pressure in the middle ear and relieve pain.  Ear pain caused by rapidly descending from high altitudes can be relieved by swallowing or chewing gum. Allowing infants to suck on a bottle during airplane travel can help.  Do not smoke in the house or near  children. If you are unable to quit smoking, smoke outside.  Control allergies. SEEK IMMEDIATE MEDICAL CARE IF:   You or your child are becoming sicker.  Pain or fever relief is not obtained with medicine.  You or your child's symptoms (pain, fever, or irritability) do not improve within 24 to 48 hours or as instructed.  Severe pain suddenly stops hurting. This may indicate a ruptured eardrum.  You or your children develop  new problems such as severe headaches, stiff neck, difficulty swallowing, or swelling of the face or around the ear. Document Released: 10/03/2003 Document Revised: 05/10/2011 Document Reviewed: 02/07/2008 [facility] Patient Information 2015 [employer] [place]. This information is not intended to replace advice given to you by your health care provider. Make sure you discuss any questions you have with your health care provider.

## 2014-04-02 NOTE — Progress Notes (Addendum)
Subjective:  This chart was scribed for Michele Staggers MD, by [name], at [facility].  This patient was seen in room 2 and the patient's care was started at 12:02 PM.    Patient ID: Michele Douglas, female    DOB: November 14, 1990, 24 y.o.   MRN: [id number]  HPI  HPI Comments: Michele Douglas is a 24 y.o. female who presents to [facility] for intermittent right ear pain for months.  Patient notes that she has had a lymph node for about a year but her pain is new.Patient was followed up by [facility] but couldn't be seen (due to her new insurance) because she needed a new referral to be seen at 1:15 PM today. Patient has no other complaints today.  Denies any fevers/chills.       Patient Active Problem List   Diagnosis Date Noted  . 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 GERD (gastroesophageal reflux disease)   . Lactose intolerance   . Asthma   . Essential tremor   . LGSIL (low grade squamous intraepithelial dysplasia) 07/2013    LGSIL on colposcopic biopsy, ECC negative   Past Surgical History  Procedure Laterality Date  . Tympanoplasty      left x 2  . Breast surgery  2006    cystectomy   Allergies  Allergen Reactions  . Ibuprofen Nausea And Vomiting   Prior to Admission medications   Medication Sig Start Date End Date Taking? Authorizing Provider  propranolol (INDERAL) 10 MG tablet Take 10 mg by mouth 2 (two) times daily.   Yes Historical Provider, MD   History   Social History  . Marital Status: Single    Spouse Name: N/A    Number of Children: N/A  . Years of Education: N/A   Occupational History  . Not on file.   Social History Main Topics  . Smoking status: Never Smoker   . Smokeless tobacco: Never Used  . Alcohol Use: Yes     Comment: mixed drink ocassionally  . Drug Use: No  . Sexual Activity: Not Currently   Other Topics Concern    . Not on file   Social History Narrative         Review of Systems  Constitutional: Negative for fever and chills.  HENT: Positive for ear pain.        Objective:   Physical Exam  Constitutional: She is oriented to person, place, and time. She appears well-developed and well-nourished. No distress.  HENT:  Head: Normocephalic and atraumatic.  Right Ear: Hearing, tympanic membrane, external ear and ear canal normal.  Left Ear: Hearing, tympanic membrane, external ear and ear canal normal.  Nose: Nose normal.  Mouth/Throat: Oropharynx is clear and moist. No oropharyngeal exudate.   Small tender node inferior to her right ear.   She has clear fluid behind the right TM no erythema.  Left TM has scarred areas but no acute effusion  Minimal right TMJ tenderness but no pain with jaw range of motion Mastoid on the right ear. Mastoid is non tender.  Eyes: Conjunctivae and EOM are normal. Pupils are equal, round, and reactive to light.  Cardiovascular: Normal rate, regular rhythm, normal heart sounds and intact distal pulses.   No murmur heard. Pulmonary/Chest: Effort normal and breath sounds normal. No respiratory distress. She has no wheezes. She has no rhonchi.  Neurological: She is alert and  oriented to person, place, and time.  Skin: Skin is warm and dry. No rash noted.  Psychiatric: She has a normal mood and affect. Her behavior is normal.  Vitals reviewed.  Filed Vitals:   04/02/14 1137  BP: 128/92  Pulse: 73  Temp: 98.2 F (36.8 C)  TempSrc: Oral  Resp: 18  Height: 5' 6.75" (1.695 m)  Weight: 267 lb 3.2 oz (121.201 kg)  SpO2: 100%          Assessment & Plan:  Michele Douglas is a 24 y.o. female Right ear pain - Plan: Ambulatory referral to ENT  Recurrent R ear pain, some possible serous otitis but increased pain and persistent LAD per pt, but decreasing in size.   -has eval this afternoon, new referral placed for ENT where she has been receiving care.    -establish care appt if needed with me for PCP.   No orders of the defined types were placed in this encounter.   Patient Instructions  Go to scheduled appt with Dr. Annalee GentaShoemaker today.  Return to the clinic or go to the nearest emergency room if any of your symptoms worsen or new symptoms occur.  Otalgia The most common reason for this in children is an infection of the middle ear. Pain from the middle ear is usually caused by a build-up of fluid and pressure behind the eardrum. Pain from an earache can be sharp, dull, or burning. The pain may be temporary or constant. The middle ear is connected to the nasal passages by a short narrow tube called the Eustachian tube. The Eustachian tube allows fluid to drain out of the middle ear, and helps keep the pressure in your ear equalized. CAUSES  A cold or allergy can block the Eustachian tube with inflammation and the build-up of secretions. This is especially likely in small children, because their Eustachian tube is shorter and more horizontal. When the Eustachian tube closes, the normal flow of fluid from the middle ear is stopped. Fluid can accumulate and cause stuffiness, pain, hearing loss, and an ear infection if germs start growing in this area. SYMPTOMS  The symptoms of an ear infection may include fever, ear pain, fussiness, increased crying, and irritability. Many children will have temporary and minor hearing loss during and right after an ear infection. Permanent hearing loss is rare, but the risk increases the more infections a child has. Other causes of ear pain include retained water in the outer ear canal from swimming and bathing. Ear pain in adults is less likely to be from an ear infection. Ear pain may be referred from other locations. Referred pain may be from the joint between your jaw and the skull. It may also come from a tooth problem or problems in the neck. Other causes of ear pain include:  A foreign body in the ear.  Outer  ear infection.  Sinus infections.  Impacted ear wax.  Ear injury.  Arthritis of the jaw or TMJ problems.  Middle ear infection.  Tooth infections.  Sore throat with pain to the ears. DIAGNOSIS  Your caregiver can usually make the diagnosis by examining you. Sometimes other special studies, including x-rays and lab work may be necessary. TREATMENT   If antibiotics were prescribed, use them as directed and finish them even if you or your child's symptoms seem to be improved.  Sometimes PE tubes are needed in children. These are little plastic tubes which are put into the eardrum during a simple surgical procedure. They  allow fluid to drain easier and allow the pressure in the middle ear to equalize. This helps relieve the ear pain caused by pressure changes. HOME CARE INSTRUCTIONS   Only take over-the-counter or prescription medicines for pain, discomfort, or fever as directed by your caregiver. DO NOT GIVE CHILDREN ASPIRIN because of the association of Reye's Syndrome in children taking aspirin.  Use a cold pack applied to the outer ear for 15-20 minutes, 03-04 times per day or as needed may reduce pain. Do not apply ice directly to the skin. You may cause frost bite.  Over-the-counter ear drops used as directed may be effective. Your caregiver may sometimes prescribe ear drops.  Resting in an upright position may help reduce pressure in the middle ear and relieve pain.  Ear pain caused by rapidly descending from high altitudes can be relieved by swallowing or chewing gum. Allowing infants to suck on a bottle during airplane travel can help.  Do not smoke in the house or near children. If you are unable to quit smoking, smoke outside.  Control allergies. SEEK IMMEDIATE MEDICAL CARE IF:   You or your child are becoming sicker.  Pain or fever relief is not obtained with medicine.  You or your child's symptoms (pain, fever, or irritability) do not improve within 24 to 48 hours  or as instructed.  Severe pain suddenly stops hurting. This may indicate a ruptured eardrum.  You or your children develop new problems such as severe headaches, stiff neck, difficulty swallowing, or swelling of the face or around the ear. Document Released: 10/03/2003 Document Revised: 05/10/2011 Document Reviewed: 02/07/2008 [facility] Patient Information 2015 [place], [place]. This information is not intended to replace advice given to you by your health care provider. Make sure you discuss any questions you have with your health care provider.     I personally performed the services described in this documentation, which was scribed in my presence. The recorded information has been reviewed and considered, and addended by me as needed.

## 2014-05-31 ENCOUNTER — Encounter: Payer: Self-pay | Admitting: Family Medicine

## 2014-06-02 ENCOUNTER — Telehealth: Payer: Self-pay

## 2014-06-02 NOTE — Telephone Encounter (Signed)
Pt has an appt with a neurologist on [phone] [facility] is, of course, requesting a referral and the pt wants to know if she HAS to [facility] to get that referral.

## 2014-06-03 DIAGNOSIS — R42 Dizziness and giddiness: Secondary | ICD-10-CM | POA: Insufficient documentation

## 2014-07-16 ENCOUNTER — Telehealth: Payer: Self-pay

## 2014-07-16 NOTE — Telephone Encounter (Signed)
Patient called concerned because her menses ended a week and a half ago and last night she saw some brown spotting when she wiped. Later on when she wiped it was bright red. Today it appears to have resolved. She said last mos she did the same type thing but she only saw a little pinkish one time with wiping.  She is not on OC's and said she is not sexually active and has not been for 5 years.  I reassured her that I thought it was okay to wait until her 08/15/14 CE scheduled with you to have this checked but that I would run it by you and if you felt she should be seen sooner I will call her back.

## 2014-07-16 NOTE — Telephone Encounter (Signed)
We will discuss next months at her annual exam. Make sure she remembers to bring this up to me.

## 2014-07-17 NOTE — Telephone Encounter (Signed)
Patient advised.

## 2014-08-15 ENCOUNTER — Encounter: Payer: Self-pay | Admitting: Gynecology

## 2014-08-15 ENCOUNTER — Other Ambulatory Visit (HOSPITAL_COMMUNITY)
Admission: RE | Admit: 2014-08-15 | Discharge: 2014-08-15 | Disposition: A | Discharge status: Home / Self Care | Payer: 59 | Source: Ambulatory Visit | Attending: Gynecology | Admitting: Gynecology

## 2014-08-15 ENCOUNTER — Telehealth: Payer: Self-pay | Admitting: *Deleted

## 2014-08-15 ENCOUNTER — Ambulatory Visit (INDEPENDENT_AMBULATORY_CARE_PROVIDER_SITE_OTHER): Payer: 59 | Admitting: Gynecology

## 2014-08-15 VITALS — BP 116/74 | Ht 67.0 in | Wt 267.0 lb

## 2014-08-15 DIAGNOSIS — R32 Unspecified urinary incontinence: Secondary | ICD-10-CM

## 2014-08-15 DIAGNOSIS — R896 Abnormal cytological findings in specimens from other organs, systems and tissues: Secondary | ICD-10-CM | POA: Diagnosis not present

## 2014-08-15 DIAGNOSIS — Z1151 Encounter for screening for human papillomavirus (HPV): Secondary | ICD-10-CM | POA: Diagnosis present

## 2014-08-15 DIAGNOSIS — Z01419 Encounter for gynecological examination (general) (routine) without abnormal findings: Secondary | ICD-10-CM | POA: Insufficient documentation

## 2014-08-15 DIAGNOSIS — IMO0002 Reserved for concepts with insufficient information to code with codable children: Secondary | ICD-10-CM

## 2014-08-15 DIAGNOSIS — L723 Sebaceous cyst: Secondary | ICD-10-CM

## 2014-08-15 LAB — COMPREHENSIVE METABOLIC PANEL
ALBUMIN: 4 g/dL (ref 3.5–5.2)
ALK PHOS: 67 U/L (ref 39–117)
ALT: 10 U/L (ref 0–35)
AST: 16 U/L (ref 0–37)
BILIRUBIN TOTAL: 0.6 mg/dL (ref 0.2–1.2)
BUN: 5 mg/dL — ABNORMAL LOW (ref 6–23)
CO2: 25 mEq/L (ref 19–32)
Calcium: 9.1 mg/dL (ref 8.4–10.5)
Chloride: 104 mEq/L (ref 96–112)
Creat: 0.86 mg/dL (ref 0.50–1.10)
Glucose, Bld: 76 mg/dL (ref 70–99)
POTASSIUM: 4.1 meq/L (ref 3.5–5.3)
SODIUM: 138 meq/L (ref 135–145)
TOTAL PROTEIN: 7 g/dL (ref 6.0–8.3)

## 2014-08-15 LAB — CBC WITH DIFFERENTIAL/PLATELET
BASOS PCT: 1 % (ref 0–1)
Basophils Absolute: 0.1 10*3/uL (ref 0.0–0.1)
EOS ABS: 0.1 10*3/uL (ref 0.0–0.7)
Eosinophils Relative: 2 % (ref 0–5)
HEMATOCRIT: 38.2 % (ref 36.0–46.0)
HEMOGLOBIN: 12.4 g/dL (ref 12.0–15.0)
LYMPHS ABS: 2 10*3/uL (ref 0.7–4.0)
Lymphocytes Relative: 37 % (ref 12–46)
MCH: 27.8 pg (ref 26.0–34.0)
MCHC: 32.5 g/dL (ref 30.0–36.0)
MCV: 85.7 fL (ref 78.0–100.0)
MONOS PCT: 10 % (ref 3–12)
MPV: 9.7 fL (ref 8.6–12.4)
Monocytes Absolute: 0.5 10*3/uL (ref 0.1–1.0)
NEUTROS ABS: 2.7 10*3/uL (ref 1.7–7.7)
NEUTROS PCT: 50 % (ref 43–77)
Platelets: 385 10*3/uL (ref 150–400)
RBC: 4.46 MIL/uL (ref 3.87–5.11)
RDW: 13.7 % (ref 11.5–15.5)
WBC: 5.4 10*3/uL (ref 4.0–10.5)

## 2014-08-15 LAB — TSH: TSH: 1.875 u[IU]/mL (ref 0.350–4.500)

## 2014-08-15 NOTE — Telephone Encounter (Signed)
Referral faxed to [facility] they will fax me back with time and date to relay to patient.

## 2014-08-15 NOTE — Progress Notes (Addendum)
Michele Douglas [birth year] [id number]        24 y.o.  G0P0 for annual exam.  Several issues noted below.  Past medical history,surgical history, problem list, medications, allergies, family history and social history were all reviewed and documented as reviewed in the EPIC chart.  ROS:  Performed with pertinent positives and negatives included in the history, assessment and plan.   Additional significant findings :  none   Exam: [facility] person Vitals:   08/15/14 0855  BP: 116/74  Height: 5\' 7"  (1.702 m)  Weight: 267 lb (121.11 kg)   General appearance:  Normal affect, orientation and appearance. Skin: Grossly normal excepting small cutaneous nodule behind right ear and small cutaneous nodule overlying right lateral sacrum. Both mobile consistent with sebaceous cysts HEENT: Without gross lesions.  No cervical or supraclavicular adenopathy. Thyroid normal.  Lungs:  Clear without wheezing, rales or rhonchi Cardiac: RR, without RMG Abdominal:  Soft, nontender, without masses, guarding, rebound, organomegaly or hernia Breasts:  Examined lying and sitting without masses, retractions, discharge or axillary adenopathy. Pelvic:  Ext/BUS/vagina normal  Cervix normal. Pap smear done  Uterus anteverted, normal size, shape and contour, midline and mobile nontender   Adnexa  Without masses or tenderness    Anus and perineum  Normal    Assessment/Plan:  24 y.o. G0P0 female for annual exam with regular menses, not sexually active.   1. Midcycle spotting. Patient over the last several months had occasional mid cycle spotting always on day 13. Suspect ovulatory related. No pain or other bleeding abnormalities. We'll watch for now. It persists she'll call we'll pursue workup with ultrasound. Will check baseline TSH now. 2. Urinary incontinence. Patient continues to have some issues with urinary incontinence which sounds spontaneous and not stress provoked. Also some urgency symptoms. Will check  urinalysis, glucose and TSH. Will arrange urology appointment for further evaluation. 3. 2 subcutaneous nodules consistent with benign etiologies. The one behind the ear has been present for over a year unchanged. The other on her right hip was just noticed. Patient will continue to monitor as long as they remain stable will follow. If large or change then she'll call and will refer to general surgeon for excision. 4. Contraception. Patient not sexually active nor plans to be. Declines contraception at this point. Will follow up for discussion if she chooses to become sexually active. 5. History of LGSIL on Pap smear last year. Follow up colposcopy and biopsy showed LGSIL on biopsy negative ECC. Pap smear done today. Follow up for results. 6. Breast health. SBE monthly reviewed. 7. STD screening discussed, offered and declined. 8. Gardasil series reportedly received. 9. Health maintenance. Will check baseline CBC comprehensive metabolic panel urinalysis TSH. Follow up as above otherwise 1 year for annual exam.     [name] MD, 9:17 AM 08/15/2014

## 2014-08-15 NOTE — Addendum Note (Signed)
Addended by: Dayna Barker on: 08/15/2014 10:08 AM   Modules accepted: Orders

## 2014-08-15 NOTE — Patient Instructions (Signed)
Office will call you to arrange for the urology appointment.  You may obtain a copy of any labs that were done today by logging onto MyChart as outlined in the instructions provided with your AVS (after visit summary). The office will not call with normal lab results but certainly if there are any significant abnormalities then we will contact you.   Health Maintenance, Female A healthy lifestyle and preventative care can promote health and wellness.  Maintain regular health, dental, and eye exams.  Eat a healthy diet. Foods like vegetables, fruits, whole grains, low-fat dairy products, and lean protein foods contain the nutrients you need without too many calories. Decrease your intake of foods high in solid fats, added sugars, and salt. Get information about a proper diet from your caregiver, if necessary.  Regular physical exercise is one of the most important things you can do for your health. Most adults should get at least 150 minutes of moderate-intensity exercise (any activity that increases your heart rate and causes you to sweat) each week. In addition, most adults need muscle-strengthening exercises on 2 or more days a week.   Maintain a healthy weight. The body mass index (BMI) is a screening tool to identify possible weight problems. It provides an estimate of body fat based on height and weight. Your caregiver can help determine your BMI, and can help you achieve or maintain a healthy weight. For adults 20 years and older:  A BMI below 18.5 is considered underweight.  A BMI of 18.5 to 24.9 is normal.  A BMI of 25 to 29.9 is considered overweight.  A BMI of 30 and above is considered obese.  Maintain normal blood lipids and cholesterol by exercising and minimizing your intake of saturated fat. Eat a balanced diet with plenty of fruits and vegetables. Blood tests for lipids and cholesterol should begin at age 91 and be repeated every 5 years. If your lipid or cholesterol levels  are high, you are over 50, or you are a high risk for heart disease, you may need your cholesterol levels checked more frequently.Ongoing high lipid and cholesterol levels should be treated with medicines if diet and exercise are not effective.  If you smoke, find out from your caregiver how to quit. If you do not use tobacco, do not start.  Lung cancer screening is recommended for adults aged 67 80 years who are at high risk for developing lung cancer because of a history of smoking. Yearly low-dose computed tomography (CT) is recommended for people who have at least a 30-pack-year history of smoking and are a current smoker or have quit within the past 15 years. A pack year of smoking is smoking an average of 1 pack of cigarettes a day for 1 year (for example: 1 pack a day for 30 years or 2 packs a day for 15 years). Yearly screening should continue until the smoker has stopped smoking for at least 15 years. Yearly screening should also be stopped for people who develop a health problem that would prevent them from having lung cancer treatment.  If you are pregnant, do not drink alcohol. If you are breastfeeding, be very cautious about drinking alcohol. If you are not pregnant and choose to drink alcohol, do not exceed 1 drink per day. One drink is considered to be 12 ounces (355 mL) of beer, 5 ounces (148 mL) of wine, or 1.5 ounces (44 mL) of liquor.  Avoid use of street drugs. Do not share needles with anyone.  Ask for help if you need support or instructions about stopping the use of drugs.  High blood pressure causes heart disease and increases the risk of stroke. Blood pressure should be checked at least every 1 to 2 years. Ongoing high blood pressure should be treated with medicines, if weight loss and exercise are not effective.  If you are 61 to 25 years old, ask your caregiver if you should take aspirin to prevent strokes.  Diabetes screening involves taking a blood sample to check your  fasting blood sugar level. This should be done once every 3 years, after age 67, if you are within normal weight and without risk factors for diabetes. Testing should be considered at a younger age or be carried out more frequently if you are overweight and have at least 1 risk factor for diabetes.  Breast cancer screening is essential preventative care for women. You should practice "breast self-awareness." This means understanding the normal appearance and feel of your breasts and may include breast self-examination. Any changes detected, no matter how small, should be reported to a caregiver. Women in their 28s and 30s should have a clinical breast exam (CBE) by a caregiver as part of a regular health exam every 1 to 3 years. After age 52, women should have a CBE every year. Starting at age 75, women should consider having a mammogram (breast X-ray) every year. Women who have a family history of breast cancer should talk to their caregiver about genetic screening. Women at a high risk of breast cancer should talk to their caregiver about having an MRI and a mammogram every year.  Breast cancer gene (BRCA)-related cancer risk assessment is recommended for women who have family members with BRCA-related cancers. BRCA-related cancers include breast, ovarian, tubal, and peritoneal cancers. Having family members with these cancers may be associated with an increased risk for harmful changes (mutations) in the breast cancer genes BRCA1 and BRCA2. Results of the assessment will determine the need for genetic counseling and BRCA1 and BRCA2 testing.  The Pap test is a screening test for cervical cancer. Women should have a Pap test starting at age 16. Between ages 29 and 79, Pap tests should be repeated every 2 years. Beginning at age 43, you should have a Pap test every 3 years as long as the past 3 Pap tests have been normal. If you had a hysterectomy for a problem that was not cancer or a condition that could  lead to cancer, then you no longer need Pap tests. If you are between ages 63 and 79, and you have had normal Pap tests going back 10 years, you no longer need Pap tests. If you have had past treatment for cervical cancer or a condition that could lead to cancer, you need Pap tests and screening for cancer for at least 20 years after your treatment. If Pap tests have been discontinued, risk factors (such as a new sexual partner) need to be reassessed to determine if screening should be resumed. Some women have medical problems that increase the chance of getting cervical cancer. In these cases, your caregiver may recommend more frequent screening and Pap tests.  The human papillomavirus (HPV) test is an additional test that may be used for cervical cancer screening. The HPV test looks for the virus that can cause the cell changes on the cervix. The cells collected during the Pap test can be tested for HPV. The HPV test could be used to screen women aged 62 years  and older, and should be used in women of any age who have unclear Pap test results. After the age of 30, women should have HPV testing at the same frequency as a Pap test.  Colorectal cancer can be detected and often prevented. Most routine colorectal cancer screening begins at the age of 77 and continues through age 49. However, your caregiver may recommend screening at an earlier age if you have risk factors for colon cancer. On a yearly basis, your caregiver may provide home test kits to check for hidden blood in the stool. Use of a small camera at the end of a tube, to directly examine the colon (sigmoidoscopy or colonoscopy), can detect the earliest forms of colorectal cancer. Talk to your caregiver about this at age 45, when routine screening begins. Direct examination of the colon should be repeated every 5 to 10 years through age 28, unless early forms of pre-cancerous polyps or small growths are found.  Hepatitis C blood testing is  recommended for all people born from [birth year] through 1966 and any individual with known risks for hepatitis C.  Practice safe sex. Use condoms and avoid high-risk sexual practices to reduce the spread of sexually transmitted infections (STIs). Sexually active women aged 73 and younger should be checked for Chlamydia, which is a common sexually transmitted infection. Older women with new or multiple partners should also be tested for Chlamydia. Testing for other STIs is recommended if you are sexually active and at increased risk.  Osteoporosis is a disease in which the bones lose minerals and strength with aging. This can result in serious bone fractures. The risk of osteoporosis can be identified using a bone density scan. Women ages 71 and over and women at risk for fractures or osteoporosis should discuss screening with their caregivers. Ask your caregiver whether you should be taking a calcium supplement or vitamin D to reduce the rate of osteoporosis.  Menopause can be associated with physical symptoms and risks. Hormone replacement therapy is available to decrease symptoms and risks. You should talk to your caregiver about whether hormone replacement therapy is right for you.  Use sunscreen. Apply sunscreen liberally and repeatedly throughout the day. You should seek shade when your shadow is shorter than you. Protect yourself by wearing long sleeves, pants, a wide-brimmed hat, and sunglasses year round, whenever you are outdoors.  Notify your caregiver of new moles or changes in moles, especially if there is a change in shape or color. Also notify your caregiver if a mole is larger than the size of a pencil eraser.  Stay current with your immunizations. Document Released: 08/31/2010 Document Revised: 06/12/2012 Document Reviewed: 08/31/2010 [facility] Patient Information 2014 [place].

## 2014-08-15 NOTE — Telephone Encounter (Signed)
-----   Message from [name], MD sent at 08/15/2014  9:21 AM EDT ----- Arrange urology appointment reference urinary incontinence.

## 2014-08-16 LAB — URINALYSIS W MICROSCOPIC + REFLEX CULTURE
Bacteria, UA: NONE SEEN
Bilirubin Urine: NEGATIVE
CASTS: NONE SEEN
Crystals: NONE SEEN
Glucose, UA: NEGATIVE mg/dL
HGB URINE DIPSTICK: NEGATIVE
Ketones, ur: NEGATIVE mg/dL
LEUKOCYTES UA: NEGATIVE
NITRITE: NEGATIVE
PH: 6 (ref 5.0–8.0)
Protein, ur: NEGATIVE mg/dL
SPECIFIC GRAVITY, URINE: 1.007 (ref 1.005–1.030)
Squamous Epithelial / LPF: NONE SEEN
Urobilinogen, UA: 0.2 mg/dL (ref 0.0–1.0)

## 2014-08-19 LAB — CYTOLOGY - PAP

## 2014-08-22 NOTE — Telephone Encounter (Signed)
Appointment on 09/20/14 @ 1:00pm with Dr.MacDiarmid

## 2014-08-22 NOTE — Telephone Encounter (Signed)
Pt informed with the below, with her insurance it requires referral when I called [employer] I was told that PCP would need to do referral I relayed to patient as well. She is going to call them to check.

## 2014-08-23 ENCOUNTER — Ambulatory Visit (INDEPENDENT_AMBULATORY_CARE_PROVIDER_SITE_OTHER): Payer: 59 | Admitting: Family Medicine

## 2014-08-23 VITALS — BP 110/70 | HR 79 | Temp 98.9°F | Resp 16 | Ht 67.0 in | Wt 260.0 lb

## 2014-08-23 DIAGNOSIS — N3941 Urge incontinence: Secondary | ICD-10-CM

## 2014-08-23 DIAGNOSIS — L989 Disorder of the skin and subcutaneous tissue, unspecified: Secondary | ICD-10-CM | POA: Diagnosis not present

## 2014-08-23 DIAGNOSIS — B079 Viral wart, unspecified: Secondary | ICD-10-CM

## 2014-08-23 DIAGNOSIS — L259 Unspecified contact dermatitis, unspecified cause: Secondary | ICD-10-CM | POA: Diagnosis not present

## 2014-08-23 MED ORDER — TRIAMCINOLONE ACETONIDE 0.1 % EX CREA: 1.0000 "application " | TOPICAL_CREAM | Freq: Two times a day (BID) | CUTANEOUS | Status: DC

## 2014-08-23 NOTE — Patient Instructions (Addendum)
I referred you to urology.   For your hand - keep area clean and covered as healing. If lesion returns, or not completely gone after freezing - recheck or I can refer you to hand surgeon to discuss excision.   Return to the clinic or go to the nearest emergency room if any of your symptoms worsen or new symptoms occur.  Urinary Incontinence Urinary incontinence is the involuntary loss of urine from your bladder. CAUSES  There are many causes of urinary incontinence. They include:  Medicines.  Infections.  Prostatic enlargement, leading to overflow of urine from your bladder.  Surgery.  Neurological diseases.  Emotional factors. SIGNS AND SYMPTOMS Urinary Incontinence can be divided into four types: 1. Urge incontinence. Urge incontinence is the involuntary loss of urine before you have the opportunity to go to the bathroom. There is a sudden urge to void but not enough time to reach a bathroom. 2. Stress incontinence. Stress incontinence is the sudden loss of urine with any activity that forces urine to pass. It is commonly caused by anatomical changes to the pelvis and sphincter areas of your body. 3. Overflow incontinence. Overflow incontinence is the loss of urine from an obstructed opening to your bladder. This results in a backup of urine and a resultant buildup of pressure within the bladder. When the pressure within the bladder exceeds the closing pressure of the sphincter, the urine overflows, which causes incontinence, similar to water overflowing a dam. 4. Total incontinence. Total incontinence is the loss of urine as a result of the inability to store urine within your bladder. DIAGNOSIS  Evaluating the cause of incontinence may require:  A thorough and complete medical and obstetric history.  A complete physical exam.  Laboratory tests such as a urine culture and sensitivities. When additional tests are indicated, they can include:  An ultrasound exam.  Kidney and  bladder X-rays.  Cystoscopy. This is an exam of the bladder using a narrow scope.  Urodynamic testing to test the nerve function to the bladder and sphincter areas. TREATMENT  Treatment for urinary incontinence depends on the cause:  For urge incontinence caused by a bacterial infection, antibiotics will be prescribed. If the urge incontinence is related to medicines you take, your health care provider may have you change the medicine.  For stress incontinence, surgery to re-establish anatomical support to the bladder or sphincter, or both, will often correct the condition.  For overflow incontinence caused by an enlarged prostate, an operation to open the channel through the enlarged prostate will allow the flow of urine out of the bladder. In women with fibroids, a hysterectomy may be recommended.  For total incontinence, surgery on your urinary sphincter may help. An artificial urinary sphincter (an inflatable cuff placed around the urethra) may be required. In women who have developed a hole-like passage between their bladder and vagina (vesicovaginal fistula), surgery to close the fistula often is required. HOME CARE INSTRUCTIONS  Normal daily hygiene and the use of pads or adult diapers that are changed regularly will help prevent odors and skin damage.  Avoid caffeine. It can overstimulate your bladder.  Use the bathroom regularly. Try about every 2-3 hours to go to the bathroom, even if you do not feel the need to do so. Take time to empty your bladder completely. After urinating, wait a minute. Then try to urinate again.  For causes involving nerve dysfunction, keep a log of the medicines you take and a journal of the times you go  to the bathroom. SEEK MEDICAL CARE IF:  You experience worsening of pain instead of improvement in pain after your procedure.  Your incontinence becomes worse instead of better. SEE IMMEDIATE MEDICAL CARE IF:  You experience fever or shaking  chills.  You are unable to pass your urine.  You have redness spreading into your groin or down into your thighs. MAKE SURE YOU:   Understand these instructions.   Will watch your condition.  Will get help right away if you are not doing well or get worse. Document Released: 03/25/2004 Document Revised: 12/06/2012 Document Reviewed: 07/25/2012 [facility] Patient Information 2015 [place], [place]. This information is not intended to replace advice given to you by your health care provider. Make sure you discuss any questions you have with your health care provider.  Warts Warts are a common viral infection. They are most commonly caused by the human papillomavirus (HPV). Warts can occur at all ages. However, they occur most frequently in older children and infrequently in the elderly. Warts may be single or multiple. Location and size varies. Warts can be spread by scratching the wart and then scratching normal skin. The life cycle of warts varies. However, most will disappear over many months to a couple years. Warts commonly do not cause problems (asymptomatic) unless they are over an area of pressure, such as the bottom of the foot. If they are large enough, they may cause pain with walking. DIAGNOSIS  Warts are most commonly diagnosed by their appearance. Tissue samples (biopsies) are not required unless the wart looks abnormal. Most warts have a rough surface, are round, oval, or irregular, and are skin-colored to light yellow, brown, or gray. They are generally less than  inch (1.3 cm), but they can be any size. TREATMENT   Observation or no treatment.  Freezing with liquid nitrogen.  High heat (cautery).  Boosting the body's immunity to fight off the wart (immunotherapy using Candida antigen).  Laser surgery.  Application of various irritants and solutions. HOME CARE INSTRUCTIONS  Follow your caregiver's instructions. No special precautions are necessary. Often, treatment may be  followed by a return (recurrence) of warts. Warts are generally difficult to treat and get rid of. If treatment is done in a clinic setting, usually more than 1 treatment is required. This is usually done on only a monthly basis until the wart is completely gone. SEEK IMMEDIATE MEDICAL CARE IF: The treated skin becomes red, puffy (swollen), or painful. Document Released: 11/25/2004 Document Revised: 06/12/2012 Document Reviewed: 05/23/2009 [facility] Patient Information 2015 [place], [place]. This information is not intended to replace advice given to you by your health care provider. Make sure you discuss any questions you have with your health care provider.

## 2014-08-23 NOTE — Progress Notes (Signed)
Subjective:    Patient ID: Michele Douglas, female    DOB: 1991-01-08, 24 y.o.   MRN: [id number]  HPI Michele Douglas is a 24 y.o. female   Urinary incontinence - with occasional loss of control of urine few times per week. Feels like incomplete emptying, but then has leakage if can't get to bathroom in time.  No loss of urine with cough/sneeze.  Seen recently by OBGYN. Normal u/a in office and had normal blood work.  Referred to urology, but referral needs to come from me. appt with [facility] July 22nd.   Wart on left finger?  Growth on L 4th finger all life - sometimes red, parent here - told it was extra digit in that area. Has tried popping with needle when younger - no recent treatments.   Additionally wants refill of cream she uses about once every 2 months - on neck or elbows when really hot outside - slight rash that improves overnight.   Patient Active Problem List   Diagnosis Date Noted  . 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 GERD (gastroesophageal reflux disease)   . Lactose intolerance   . Asthma   . Essential tremor   . LGSIL (low grade squamous intraepithelial dysplasia) 07/2013    LGSIL on colposcopic biopsy, ECC negative   Past Surgical History  Procedure Laterality Date  . Tympanoplasty      left x 2  . Breast surgery  2006    cystectomy   Allergies  Allergen Reactions  . Ibuprofen Nausea And Vomiting   Prior to Admission medications   Medication Sig Start Date End Date Taking? Authorizing Provider  Multiple Vitamin (MULTIVITAMIN) tablet Take 1 tablet by mouth daily.   Yes Historical Provider, MD  propranolol (INDERAL) 10 MG tablet Take 10 mg by mouth 2 (two) times daily.   Yes Historical Provider, MD   History   Social History  . Marital Status: Single    Spouse Name: N/A  . Number of Children: N/A  . Years of Education: N/A   Occupational History  . Not on file.   Social  History Main Topics  . Smoking status: Never Smoker   . Smokeless tobacco: Never Used  . Alcohol Use: 0.0 oz/week    0 Standard drinks or equivalent per week     Comment: mixed drink ocassionally  . Drug Use: No  . Sexual Activity: Not Currently     Comment: 1st intercourse 24 yo-Fewer than 5 partners   Other Topics Concern  . Not on file   Social History Narrative       Review of Systems  Gastrointestinal: Negative for abdominal pain.  Genitourinary: Positive for urgency and difficulty urinating. Negative for dysuria, hematuria, flank pain, enuresis, genital sores and pelvic pain.  Skin: Negative for rash.       Objective:   Physical Exam  Constitutional: She appears well-developed and well-nourished. No distress.  HENT:  Head: Normocephalic and atraumatic.  Pulmonary/Chest: Effort normal.  Musculoskeletal:       Left hand: Normal sensation noted.       Hands: Skin: Skin is warm and dry.  Vitals reviewed.  Filed Vitals:   08/23/14 0814  BP: 110/70  Pulse: 79  Temp: 98.9 F (37.2 C)  TempSrc: Oral  Resp: 16  Height:  (1.702 m)  Weight: 260 lb (117.935 kg)  SpO2: 99%   Risks (including but not limited to  bleeding and infection of resultant wound), benefits, and alternatives discussed for cryodestruction of suspected verrucal lesion of finger.  Verbal consent obtained after any questions were answered. 3 freeze/thaw cycles with liquid nitrogen applied to lesion only. Tolerated without complications. Aftercare and RTC precautions discussed       Assessment & Plan:   Michele Douglas is a 24 y.o. female Urge incontinence - Plan: Ambulatory referral to Urology  -discussed KEgel exercises, but will have followed up with urology - referral placed.   Lesion of finger, Viral wart on finger  -appears to be common wart. Options discussed, chose to have cryodestruction.  Aftercare and risks discussed. If persistent - consider hand eval for biopsy/removal. rtc  precautions.   Contact dermatitis - Plan: triamcinolone cream (KENALOG) 0.1 %  -intermittent sx;s - triamcinolone provided if needed as rare use.   Meds ordered this encounter  Medications  . triamcinolone cream (KENALOG) 0.1 %    Sig: Apply 1 application topically 2 (two) times daily.    Dispense:  30 g    Refill:  1   Patient Instructions  I referred you to urology.   For your hand - keep area clean and covered as healing. If lesion returns, or not completely gone after freezing - recheck or I can refer you to hand surgeon to discuss excision.   Return to the clinic or go to the nearest emergency room if any of your symptoms worsen or new symptoms occur.  Urinary Incontinence Urinary incontinence is the involuntary loss of urine from your bladder. CAUSES  There are many causes of urinary incontinence. They include:  Medicines.  Infections.  Prostatic enlargement, leading to overflow of urine from your bladder.  Surgery.  Neurological diseases.  Emotional factors. SIGNS AND SYMPTOMS Urinary Incontinence can be divided into four types: 1. Urge incontinence. Urge incontinence is the involuntary loss of urine before you have the opportunity to go to the bathroom. There is a sudden urge to void but not enough time to reach a bathroom. 2. Stress incontinence. Stress incontinence is the sudden loss of urine with any activity that forces urine to pass. It is commonly caused by anatomical changes to the pelvis and sphincter areas of your body. 3. Overflow incontinence. Overflow incontinence is the loss of urine from an obstructed opening to your bladder. This results in a backup of urine and a resultant buildup of pressure within the bladder. When the pressure within the bladder exceeds the closing pressure of the sphincter, the urine overflows, which causes incontinence, similar to water overflowing a dam. 4. Total incontinence. Total incontinence is the loss of urine as a result of  the inability to store urine within your bladder. DIAGNOSIS  Evaluating the cause of incontinence may require:  A thorough and complete medical and obstetric history.  A complete physical exam.  Laboratory tests such as a urine culture and sensitivities. When additional tests are indicated, they can include:  An ultrasound exam.  Kidney and bladder X-rays.  Cystoscopy. This is an exam of the bladder using a narrow scope.  Urodynamic testing to test the nerve function to the bladder and sphincter areas. TREATMENT  Treatment for urinary incontinence depends on the cause:  For urge incontinence caused by a bacterial infection, antibiotics will be prescribed. If the urge incontinence is related to medicines you take, your health care provider may have you change the medicine.  For stress incontinence, surgery to re-establish anatomical support to the bladder or sphincter, or both, will  often correct the condition.  For overflow incontinence caused by an enlarged prostate, an operation to open the channel through the enlarged prostate will allow the flow of urine out of the bladder. In women with fibroids, a hysterectomy may be recommended.  For total incontinence, surgery on your urinary sphincter may help. An artificial urinary sphincter (an inflatable cuff placed around the urethra) may be required. In women who have developed a hole-like passage between their bladder and vagina (vesicovaginal fistula), surgery to close the fistula often is required. HOME CARE INSTRUCTIONS  Normal daily hygiene and the use of pads or adult diapers that are changed regularly will help prevent odors and skin damage.  Avoid caffeine. It can overstimulate your bladder.  Use the bathroom regularly. Try about every 2-3 hours to go to the bathroom, even if you do not feel the need to do so. Take time to empty your bladder completely. After urinating, wait a minute. Then try to urinate again.  For causes  involving nerve dysfunction, keep a log of the medicines you take and a journal of the times you go to the bathroom. SEEK MEDICAL CARE IF:  You experience worsening of pain instead of improvement in pain after your procedure.  Your incontinence becomes worse instead of better. SEE IMMEDIATE MEDICAL CARE IF:  You experience fever or shaking chills.  You are unable to pass your urine.  You have redness spreading into your groin or down into your thighs. MAKE SURE YOU:   Understand these instructions.   Will watch your condition.  Will get help right away if you are not doing well or get worse. Document Released: 03/25/2004 Document Revised: 12/06/2012 Document Reviewed: 07/25/2012 [facility] Patient Information 2015 [place], [place]. This information is not intended to replace advice given to you by your health care provider. Make sure you discuss any questions you have with your health care provider.  Warts Warts are a common viral infection. They are most commonly caused by the human papillomavirus (HPV). Warts can occur at all ages. However, they occur most frequently in older children and infrequently in the elderly. Warts may be single or multiple. Location and size varies. Warts can be spread by scratching the wart and then scratching normal skin. The life cycle of warts varies. However, most will disappear over many months to a couple years. Warts commonly do not cause problems (asymptomatic) unless they are over an area of pressure, such as the bottom of the foot. If they are large enough, they may cause pain with walking. DIAGNOSIS  Warts are most commonly diagnosed by their appearance. Tissue samples (biopsies) are not required unless the wart looks abnormal. Most warts have a rough surface, are round, oval, or irregular, and are skin-colored to light yellow, brown, or gray. They are generally less than  inch (1.3 cm), but they can be any size. TREATMENT   Observation or no  treatment.  Freezing with liquid nitrogen.  High heat (cautery).  Boosting the body's immunity to fight off the wart (immunotherapy using Candida antigen).  Laser surgery.  Application of various irritants and solutions. HOME CARE INSTRUCTIONS  Follow your caregiver's instructions. No special precautions are necessary. Often, treatment may be followed by a return (recurrence) of warts. Warts are generally difficult to treat and get rid of. If treatment is done in a clinic setting, usually more than 1 treatment is required. This is usually done on only a monthly basis until the wart is completely gone. SEEK IMMEDIATE MEDICAL CARE  IF: The treated skin becomes red, puffy (swollen), or painful. Document Released: 11/25/2004 Document Revised: 06/12/2012 Document Reviewed: 05/23/2009 [facility] Patient Information 2015 [place], [place]. This information is not intended to replace advice given to you by your health care provider. Make sure you discuss any questions you have with your health care provider.

## 2014-09-03 ENCOUNTER — Ambulatory Visit (INDEPENDENT_AMBULATORY_CARE_PROVIDER_SITE_OTHER): Payer: 59 | Admitting: Physician Assistant

## 2014-09-03 VITALS — BP 110/84 | HR 79 | Temp 98.4°F | Resp 18 | Ht 67.0 in | Wt 269.2 lb

## 2014-09-03 DIAGNOSIS — B079 Viral wart, unspecified: Secondary | ICD-10-CM

## 2014-09-03 DIAGNOSIS — M25562 Pain in left knee: Secondary | ICD-10-CM

## 2014-09-03 NOTE — Progress Notes (Signed)
09/03/2014 at 6:48 PM  Michele Douglas / DOB: 1990/10/24 / MRN: [id number]  The patient has LGSIL (low grade squamous intraepithelial dysplasia) on her problem list.  SUBJECTIVE  Chief complaint: Follow-up  Patient here for a wart on her finger and for right leg pain.  Reports the leg pain started roughly 12 days ago and hurts in the upper lateral calf area and posterior thigh.  She is worried because the pain started after a 12 hour trip from [place]. She denies SOB, chest pain and palpitations.  She denies leg swelling and erythema.   She would like to have a wart on her finger frozen tonight.  She has seen Dr. Neva SeatGreene recently and reports he sprayed the growth 3 times and the wart did improve.    She  has a past medical history of GERD (gastroesophageal reflux disease); Lactose intolerance; Asthma; Essential tremor; and LGSIL (low grade squamous intraepithelial dysplasia) (07/2013).    Medications reviewed and updated by myself where necessary, and exist elsewhere in the encounter.   Michele Douglas is allergic to ibuprofen. She  reports that she has never smoked. She has never used smokeless tobacco. She reports that she drinks alcohol. She reports that she does not use illicit drugs. She  reports that she does not currently engage in sexual activity. The patient  has past surgical history that includes Tympanoplasty and Breast surgery (2006).  Her family history includes Hypertension in her mother.  Review of Systems  Constitutional: Negative for fever.  Respiratory: Negative for cough, sputum production and shortness of breath.   Cardiovascular: Negative for chest pain, palpitations, leg swelling and PND.  Gastrointestinal: Negative for nausea, vomiting and abdominal pain.  Genitourinary: Negative for dysuria.  Musculoskeletal: Negative for myalgias.  Skin: Negative for itching and rash.  Neurological: Negative for dizziness and headaches.  Psychiatric/Behavioral: Negative for depression.      OBJECTIVE  Her  height is 5\' 7"  (1.702 m) and weight is 269 lb 4 oz (122.131 kg). Her oral temperature is 98.4 F (36.9 C). Her blood pressure is 110/84 and her pulse is 79. Her respiration is 18 and oxygen saturation is 99%.  The patient's body mass index is 42.16 kg/(m^2).  Physical Exam  Vitals reviewed. Constitutional: She is oriented to person, place, and time. She appears well-developed and well-nourished. No distress.  Cardiovascular: Normal rate.   Respiratory: Effort normal.  GI: Soft.  Neurological: She is alert and oriented to person, place, and time. No cranial nerve deficit. Coordination normal.  Skin: Skin is warm and dry. She is not diaphoretic.  Psychiatric: She has a normal mood and affect. Her behavior is normal. Judgment and thought content normal.    No results found for this or any previous visit (from the past 24 hour(s)).  ASSESSMENT & PLAN  Arlet was seen today for follow-up.  Diagnoses and all orders for this visit:  Pain in joint, lower leg, left: Wells Criteria 0.  I doubt this is a blood clot, however, patient is worried and I will order D-dimer to rule out. Will scan the leg per D-dimer.  Orders: -     D-dimer, quantitative (not at [facility])  Viral wart on finger: Destruction of benign skin lesion with cold spray.    The patient was advised to call or come back to clinic if she does not see an improvement in symptoms, or worsens with the above plan.   Deliah BostonMichael Clark, MHS, PA-C [facility] 09/03/2014 6:48 PM

## 2014-09-04 LAB — D-DIMER, QUANTITATIVE: D-Dimer, Quant: 0.27 ug/mL-FEU (ref 0.00–0.48)

## 2014-09-16 ENCOUNTER — Encounter (HOSPITAL_COMMUNITY): Payer: Self-pay

## 2014-09-16 ENCOUNTER — Emergency Department (HOSPITAL_BASED_OUTPATIENT_CLINIC_OR_DEPARTMENT_OTHER): Admit: 2014-09-16 | Discharge: 2014-09-16 | Disposition: A | Discharge status: Home / Self Care | Payer: 59

## 2014-09-16 ENCOUNTER — Emergency Department (HOSPITAL_COMMUNITY)
Admission: EM | Admit: 2014-09-16 | Discharge: 2014-09-16 | Disposition: A | Discharge status: Home / Self Care | Payer: 59 | Attending: Emergency Medicine | Admitting: Emergency Medicine

## 2014-09-16 DIAGNOSIS — J45909 Unspecified asthma, uncomplicated: Secondary | ICD-10-CM | POA: Diagnosis not present

## 2014-09-16 DIAGNOSIS — M79661 Pain in right lower leg: Secondary | ICD-10-CM | POA: Diagnosis not present

## 2014-09-16 DIAGNOSIS — Z7952 Long term (current) use of systemic steroids: Secondary | ICD-10-CM | POA: Diagnosis not present

## 2014-09-16 DIAGNOSIS — Z79899 Other long term (current) drug therapy: Secondary | ICD-10-CM | POA: Diagnosis not present

## 2014-09-16 DIAGNOSIS — Z8639 Personal history of other endocrine, nutritional and metabolic disease: Secondary | ICD-10-CM | POA: Diagnosis not present

## 2014-09-16 DIAGNOSIS — Z8719 Personal history of other diseases of the digestive system: Secondary | ICD-10-CM | POA: Diagnosis not present

## 2014-09-16 DIAGNOSIS — Z8669 Personal history of other diseases of the nervous system and sense organs: Secondary | ICD-10-CM | POA: Insufficient documentation

## 2014-09-16 DIAGNOSIS — M79604 Pain in right leg: Secondary | ICD-10-CM | POA: Diagnosis present

## 2014-09-16 DIAGNOSIS — M79609 Pain in unspecified limb: Secondary | ICD-10-CM

## 2014-09-16 NOTE — Progress Notes (Signed)
*  PRELIMINARY RESULTS* Vascular Ultrasound Right lower extremity venous duplex has been completed.  Preliminary findings: negative for DVT.   Farrel DemarkJill Eunice, RDMS, RVT  09/16/2014, 3:53 PM

## 2014-09-16 NOTE — ED Notes (Signed)
Per Patient, Patient has had a lot of recent travel. Pt started to noticed right calf pain after the travel. Patient denies swelling, redness, and warmth. Patient ambulated into the room. Alert and oriented x4. Patient denies chest pain and SOB.

## 2014-09-16 NOTE — ED Provider Notes (Signed)
CSN: [id number]     Arrival date & time 09/16/14  1512 History   First MD Initiated Contact with Patient 09/16/14 1518     No chief complaint on file.    (Consider location/radiation/quality/duration/timing/severity/associated sxs/prior Treatment) HPI Michele Douglas is a 24 y.o. female with hx of asthma, presents to ED with complaint of right calf pain. Pain started on 08/22/14, [place] to onset of pain. Has seen PCP, had negative ddimer 12 days ago, however pain continued. She denies any other injuries. No numbness or weakness in the foot. Has had significant swelling in both legs after the drive however, swelling has subsided. Denies fever, chills, redness, discoloration, bruising. Ambulatory.    Past Medical History  Diagnosis Date  . GERD (gastroesophageal reflux disease)   . Lactose intolerance   . Asthma   . Essential tremor   . LGSIL (low grade squamous intraepithelial dysplasia) 07/2013    LGSIL on colposcopic biopsy, ECC negative   Past Surgical History  Procedure Laterality Date  . Tympanoplasty      left x 2  . Breast surgery  2006    cystectomy   Family History  Problem Relation Age of Onset  . Hypertension Mother    History  Substance Use Topics  . Smoking status: Never Smoker   . Smokeless tobacco: Never Used  . Alcohol Use: 0.0 oz/week    0 Standard drinks or equivalent per week     Comment: mixed drink ocassionally   OB History    Gravida Para Term Preterm AB TAB SAB Ectopic Multiple Living   0              Review of Systems  Cardiovascular: Positive for leg swelling.  Musculoskeletal: Positive for myalgias.  Skin: Negative for color change.  Neurological: Negative for weakness and numbness.      Allergies  Ibuprofen  Home Medications   Prior to Admission medications   Medication Sig Start Date End Date Taking? Authorizing Provider  Multiple Vitamin (MULTIVITAMIN) tablet Take 1 tablet by mouth daily.     Historical Provider, MD  propranolol (INDERAL) 10 MG tablet Take 10 mg by mouth 2 (two) times daily.    Historical Provider, MD  triamcinolone cream (KENALOG) 0.1 % Apply 1 application topically 2 (two) times daily. 08/23/14   Shade FloodJeffrey R Greene, MD   There were no vitals taken for this visit. Physical Exam  Constitutional: She appears well-developed and well-nourished. No distress.  Eyes: Conjunctivae are normal.  Neck: Neck supple.  Musculoskeletal:  No lower extremity edema. Tender to palpation over right calf, positive Homans sign on the right leg. Dorsal pedal pulses are equal and intact bilaterally. No bruising, swelling, erythema over her right calf area.  Neurological: She is alert.  Skin: Skin is warm and dry.  Nursing note and vitals reviewed.   ED Course  Procedures (including critical care time) Labs Review Labs Reviewed - No data to display  Imaging Review No results found.   EKG Interpretation None      MDM   Final diagnoses:  Right calf pain   Pt with right calf pain, recent long car travel. No other injuries. Positive homans sign. Will get LE venous duplex to ro dvt. No hx of the same.   4:14 PM Negative doppler. Most likely a strain. Home with NSAIDs and tylenol and close follow up.   Filed Vitals:   09/16/14 1523  BP: 134/74  Pulse: 67  Temp: 99 F (37.2 C)  TempSrc: Oral  Resp: 17  SpO2: 100%     Jaynie Crumble, PA-C 09/16/14 1615  Mancel Bale, MD 09/16/14 [phone]

## 2014-09-16 NOTE — Discharge Instructions (Signed)
Ice, elevate, tylenol for pain. Follow up as needed.

## 2014-11-08 ENCOUNTER — Ambulatory Visit (INDEPENDENT_AMBULATORY_CARE_PROVIDER_SITE_OTHER): Payer: 59 | Admitting: Physician Assistant

## 2014-11-08 VITALS — BP 118/84 | HR 90 | Temp 97.9°F | Resp 18 | Ht 66.5 in | Wt 263.0 lb

## 2014-11-08 DIAGNOSIS — S60425A Blister (nonthermal) of left ring finger, initial encounter: Secondary | ICD-10-CM

## 2014-11-08 DIAGNOSIS — S60429A Blister (nonthermal) of unspecified finger, initial encounter: Secondary | ICD-10-CM

## 2014-11-08 NOTE — Progress Notes (Signed)
   Michele Douglas  MRN: [id number] DOB: 13-Aug-1990  Subjective:  Pt presents to clinic for evaluation of a blister on finger that occurred after had cryotherapy for wart treatment 4 days ago. She has previously had the wart frozen a couple of times but no help with size reduction of the wart -in fact she thinks that it may have gotten bigger after 1 of the freezings. Over the last three days, the area has blistered up and has become erythematous and tender to the touch, today it does seem better than it was yesterday. She is able to maintain range of motion in the finger.   Patient Active Problem List   Diagnosis Date Noted  . 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 propranolol (INDERAL) 10 MG tablet Take 10 mg by mouth 2 (two) times daily.    . Multiple Vitamin (MULTIVITAMIN) tablet Take 1 tablet by mouth daily.    Marland Kitchen triamcinolone cream (KENALOG) 0.1 % Apply 1 application topically 2 (two) times daily. (Patient not taking: Reported on 11/08/2014) 30 g 1   No current facility-administered medications on file prior to visit.    Allergies  Allergen Reactions  . Ibuprofen Nausea And Vomiting    Review of Systems  Constitutional: Negative for fever and chills.  Skin: Positive for wound.   Objective:  BP 118/84 mmHg  Pulse 90  Temp(Src) 97.9 F (36.6 C) (Oral)  Resp 18  Ht 5' 6.5" (1.689 m)  Wt 263 lb (119.296 kg)  BMI 41.82 kg/m2  SpO2 99%  LMP 10/23/2014  Physical Exam  Constitutional: She is oriented to person, place, and time and well-developed, well-nourished, and in no distress.  HENT:  Head: Normocephalic and atraumatic.  Right Ear: Hearing and external ear normal.  Left Ear: Hearing and external ear normal.  Eyes: Conjunctivae are normal.  Neck: Normal range of motion.  Pulmonary/Chest: Effort normal.  Neurological: She is alert and oriented to  person, place, and time. Gait normal.  Skin: Skin is warm and dry.  Vesicule about 1.5 cm in size on the left 4th digit with a wart in the blister that is mobile - the blister was large enough to be bothering the patient so I took an 18g needle and opened the blister - serous fluid was drained.  Psychiatric: Mood, memory, affect and judgment normal.  Vitals reviewed.   Assessment and Plan :  Blister of finger, initial encounter  2nd to cryotherapy and what seems to be a normal reaction without any signs of infection - pt will remove skin flap in a couple of days when the base of the blister has had a chance to heal over for pain control - she will RTC if the wart does not completely come off - she was instructed she could also try a wart stick to help with the removal of the wart.  Benny Lennert PA-C  [facility] 11/08/2014 6:03 PM

## 2014-11-08 NOTE — Progress Notes (Signed)
Subjective:     Patient ID: Michele Douglas, female   DOB: January 11, 1991, 24 y.o.   MRN: [id number] Shade Flood, MD  Chief Complaint  Patient presents with  . Finger Injury    Left ring finger has a large blister. x2 days    HPI Patient presents for evaluation of a finger injury. She had a wart on her left ring finger frozen 4 days ago. She has previously had the wart frozen a couple of times. Over the last three days, the area has blistered up and has become tender to the touch. She is able to maintain range of motion in the finger.    Review of Systems  Skin: Positive for wound (Blister to left ring finger ).  See HPI  Patient Active Problem List   Diagnosis Date Noted  . 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  propranolol (INDERAL) 10 MG tablet, Take 10 mg by mouth 2 (two) times daily., Disp: , Rfl:  .  Multiple Vitamin (MULTIVITAMIN) tablet, Take 1 tablet by mouth daily., Disp: , Rfl:  .  triamcinolone cream (KENALOG) 0.1 %, Apply 1 application topically 2 (two) times daily. (Patient not taking: Reported on 11/08/2014), Disp: 30 g, Rfl: 1    Allergies  Allergen Reactions  . Ibuprofen Nausea And Vomiting        Objective:   Physical Exam  Constitutional: She is oriented to person, place, and time. She appears well-developed and well-nourished.  HENT:  Head: Normocephalic and atraumatic.  Musculoskeletal:       Hands: Neurological: She is alert and oriented to person, place, and time.  Skin: Skin is warm and dry.  Psychiatric: She has a normal mood and affect. Her behavior is normal. Thought content normal.      BP 118/84 mmHg  Pulse 90  Temp(Src) 97.9 F (36.6 C) (Oral)  Resp 18  Ht 5' 6.5" (1.689 m)  Wt 263 lb (119.296 kg)  BMI 41.82 kg/m2  SpO2 99%  LMP 10/23/2014   Assessment & Plan:     1. Blister of finger, initial encounter Blister was opened with an 18G needle to allow for  drainage. Patient was counseled to allow the wound to continue draining over the weekend and to begin removing the overlying blistered skin and wart in a few days.        Monserrate Blaschke D. Race, PA-S Physician Assistant Student [facility]

## 2015-02-16 ENCOUNTER — Ambulatory Visit (INDEPENDENT_AMBULATORY_CARE_PROVIDER_SITE_OTHER): Payer: 59 | Admitting: Family Medicine

## 2015-02-16 VITALS — BP 120/82 | HR 78 | Temp 98.1°F | Resp 16 | Ht 67.0 in | Wt 273.0 lb

## 2015-02-16 DIAGNOSIS — R42 Dizziness and giddiness: Secondary | ICD-10-CM | POA: Diagnosis not present

## 2015-02-16 DIAGNOSIS — H4710 Unspecified papilledema: Secondary | ICD-10-CM

## 2015-02-16 DIAGNOSIS — G250 Essential tremor: Secondary | ICD-10-CM | POA: Insufficient documentation

## 2015-02-16 DIAGNOSIS — J4520 Mild intermittent asthma, uncomplicated: Secondary | ICD-10-CM

## 2015-02-16 DIAGNOSIS — R110 Nausea: Secondary | ICD-10-CM

## 2015-02-16 MED ORDER — ALBUTEROL SULFATE 108 (90 BASE) MCG/ACT IN AEPB: 108.0000 ug | INHALATION_SPRAY | RESPIRATORY_TRACT | Status: DC | PRN

## 2015-02-16 MED ORDER — ALBUTEROL SULFATE HFA 108 (90 BASE) MCG/ACT IN AERS: 1.0000 | INHALATION_SPRAY | RESPIRATORY_TRACT | Status: DC | PRN

## 2015-02-16 MED ORDER — BECLOMETHASONE DIPROPIONATE 40 MCG/ACT IN AERS: 1.0000 | INHALATION_SPRAY | Freq: Two times a day (BID) | RESPIRATORY_TRACT | Status: DC

## 2015-02-16 NOTE — Patient Instructions (Signed)
Continue albuterol as needed, but if increased need as we discussed - start Qvar 1-2 puffs twice per day (can start at one puff twice per day - increase to 2 if not controlling symptoms in a week to 10 days). Return to the clinic or go to the nearest emergency room if any of your symptoms worsen or new symptoms occur.  I will refer you to neuro. Return here or ER if worsening symptoms.   Asthma, Adult Asthma is a recurring condition in which the airways tighten and narrow. Asthma can make it difficult to breathe. It can cause coughing, wheezing, and shortness of breath. Asthma episodes, also called asthma attacks, range from minor to life-threatening. Asthma cannot be cured, but medicines and lifestyle changes can help control it. CAUSES Asthma is believed to be caused by inherited (genetic) and environmental factors, but its exact cause is unknown. Asthma may be triggered by allergens, lung infections, or irritants in the air. Asthma triggers are different for each person. Common triggers include:   Animal dander.  Dust mites.  Cockroaches.  Pollen from trees or grass.  Mold.  Smoke.  Air pollutants such as dust, household cleaners, hair sprays, aerosol sprays, paint fumes, strong chemicals, or strong odors.  Cold air, weather changes, and winds (which increase molds and pollens in the air).  Strong emotional expressions such as crying or laughing hard.  Stress.  Certain medicines (such as aspirin) or types of drugs (such as beta-blockers).  Sulfites in foods and drinks. Foods and drinks that may contain sulfites include dried fruit, potato chips, and sparkling grape juice.  Infections or inflammatory conditions such as the flu, a cold, or an inflammation of the nasal membranes (rhinitis).  Gastroesophageal reflux disease (GERD).  Exercise or strenuous activity. SYMPTOMS Symptoms may occur immediately after asthma is triggered or many hours later. Symptoms  include:  Wheezing.  Excessive nighttime or early morning coughing.  Frequent or severe coughing with a common cold.  Chest tightness.  Shortness of breath. DIAGNOSIS  The diagnosis of asthma is made by a review of your medical history and a physical exam. Tests may also be performed. These may include:  Lung function studies. These tests show how much air you breathe in and out.  Allergy tests.  Imaging tests such as X-rays. TREATMENT  Asthma cannot be cured, but it can usually be controlled. Treatment involves identifying and avoiding your asthma triggers. It also involves medicines. There are 2 classes of medicine used for asthma treatment:   Controller medicines. These prevent asthma symptoms from occurring. They are usually taken every day.  Reliever or rescue medicines. These quickly relieve asthma symptoms. They are used as needed and provide short-term relief. Your health care provider will help you create an asthma action plan. An asthma action plan is a written plan for managing and treating your asthma attacks. It includes a list of your asthma triggers and how they may be avoided. It also includes information on when medicines should be taken and when their dosage should be changed. An action plan may also involve the use of a device called a peak flow meter. A peak flow meter measures how well the lungs are working. It helps you monitor your condition. HOME CARE INSTRUCTIONS   Take medicines only as directed by your health care provider. Speak with your health care provider if you have questions about how or when to take the medicines.  Use a peak flow meter as directed by your health care  provider. Record and keep track of readings.  Understand and use the action plan to help minimize or stop an asthma attack without needing to seek medical care.  Control your home environment in the following ways to help prevent asthma attacks:  Do not smoke. Avoid being exposed to  secondhand smoke.  Change your heating and air conditioning filter regularly.  Limit your use of fireplaces and wood stoves.  Get rid of pests (such as roaches and mice) and their droppings.  Throw away plants if you see mold on them.  Clean your floors and dust regularly. Use unscented cleaning products.  Try to have someone else vacuum for you regularly. Stay out of rooms while they are being vacuumed and for a short while afterward. If you vacuum, use a dust mask from a hardware store, a double-layered or microfilter vacuum cleaner bag, or a vacuum cleaner with a HEPA filter.  Replace carpet with wood, tile, or vinyl flooring. Carpet can trap dander and dust.  Use allergy-proof pillows, mattress covers, and box spring covers.  Wash bed sheets and blankets every week in hot water and dry them in a dryer.  Use blankets that are made of polyester or cotton.  Clean bathrooms and kitchens with bleach. If possible, have someone repaint the walls in these rooms with mold-resistant paint. Keep out of the rooms that are being cleaned and painted.  Wash hands frequently. SEEK MEDICAL CARE IF:   You have wheezing, shortness of breath, or a cough even if taking medicine to prevent attacks.  The colored mucus you cough up (sputum) is thicker than usual.  Your sputum changes from clear or white to yellow, green, gray, or bloody.  You have any problems that may be related to the medicines you are taking (such as a rash, itching, swelling, or trouble breathing).  You are using a reliever medicine more than 2-3 times per week.  Your peak flow is still at 50-79% of your personal best after following your action plan for 1 hour.  You have a fever. SEEK IMMEDIATE MEDICAL CARE IF:   You seem to be getting worse and are unresponsive to treatment during an asthma attack.  You are short of breath even at rest.  You get short of breath when doing very little physical activity.  You have  difficulty eating, drinking, or talking due to asthma symptoms.  You develop chest pain.  You develop a fast heartbeat.  You have a bluish color to your lips or fingernails.  You are light-headed, dizzy, or faint.  Your peak flow is less than 50% of your personal best.   This information is not intended to replace advice given to you by your health care provider. Make sure you discuss any questions you have with your health care provider.   Document Released: 02/15/2005 Document Revised: 11/06/2014 Document Reviewed: 09/14/2012 Elsevier Interactive Patient Education [employer]

## 2015-02-16 NOTE — Progress Notes (Signed)
Subjective:    Patient ID: Michele Douglas, female    DOB: 23-Jan-1991, 24 y.o.   MRN: [id number]  HPI Michele Douglas is a 24 y.o. female  Asthma - has used Dulera in past (stopped earlier this year, but still has), but asthma been improved since exercising son only needing albuterol as needed with workouts - 2 times per week, or with weather change - once per day. No nighttime wakening. No recent hospitalizations.    Needs referral for neurology.  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  Seen recently by her optho -  Michele Douglas, OD. - 01/27/15- borderline findings OU of optic nerve head edema and elevation, mld suspicion of papilledema or possible pseudotumor cerebri.   No focal weakness, no change in tremor. No change in mentation. No vomiting. Dizziness or nausea every day at times, but comes and goes - not persistent. Noticed these symptoms since February, but worse since this summer.    Patient Active Problem List   Diagnosis Date Noted  . Benign essential tremor 02/16/2015  . Intermittent vertigo 06/03/2014  . 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 GERD (gastroesophageal reflux disease)   . Lactose intolerance   . Asthma   . Essential tremor   . LGSIL (low grade squamous intraepithelial dysplasia) 07/2013    LGSIL on colposcopic biopsy, ECC negative   Past Surgical History  Procedure Laterality Date  . Tympanoplasty      left x 2  . Breast surgery  2006    cystectomy   Allergies  Allergen Reactions  . Lactose Other (See Comments)    Sick very very bad   . Ibuprofen Nausea And Vomiting   Prior to Admission medications     Medication Sig Start Date End Date Taking? Authorizing Provider  albuterol (PROVENTIL HFA;VENTOLIN HFA) 108 (90 BASE) MCG/ACT inhaler Inhale into the lungs every 6 (six) hours as needed for wheezing or shortness of breath.   Yes Historical Provider, MD  Multiple Vitamin (MULTIVITAMIN) tablet Take 1 tablet by mouth daily.   Yes Historical Provider, MD  triamcinolone cream (KENALOG) 0.1 % Apply 1 application topically 2 (two) times daily. 08/23/14  Yes Shade Flood, MD  propranolol ER (INDERAL LA) 60 MG 24 hr capsule Take 60 mg by mouth daily. 01/27/15   Historical Provider, MD   Social History   Social History  . Marital Status: Single    Spouse Name: N/A  . Number of Children: N/A  . Years of Education: N/A   Occupational History  . Not on file.   Social History Main Topics  . Smoking status: Never Smoker   . Smokeless tobacco: Never Used  . Alcohol Use: 0.0 oz/week    0 Standard drinks or equivalent per week     Comment: mixed drink ocassionally  . Drug Use: No  . Sexual Activity: Not Currently    Birth Control/ Protection: None     Comment: 1st intercourse 24 yo-Fewer than 5 partners   Other Topics Concern  . Not on file   Social History Narrative       Review of Systems  Constitutional: Negative for fever.  Eyes:  Positive for pain (behind eyes. ). Negative for photophobia and visual disturbance.  Respiratory: Positive for wheezing (intermittent. ). Negative for shortness of breath.   Skin: Negative for rash.  Neurological: Positive for dizziness and tremors. Negative for seizures, syncope, facial asymmetry, speech difficulty, weakness, light-headedness and headaches.       Objective:   Physical Exam  Constitutional: She is oriented to person, place, and time. She appears well-developed and well-nourished. No distress.  HENT:  Head: Normocephalic and atraumatic.  Right Ear: Hearing, tympanic membrane, external ear and ear canal normal.  Left Ear: Hearing,  tympanic membrane, external ear and ear canal normal.  Nose: Nose normal.  Mouth/Throat: Oropharynx is clear and moist. No oropharyngeal exudate.  Eyes: Conjunctivae, EOM and lids are normal. Pupils are equal, round, and reactive to light. Right eye exhibits no nystagmus. Left eye exhibits no nystagmus.  Cardiovascular: Normal rate, regular rhythm, normal heart sounds and intact distal pulses.   No murmur heard. Pulmonary/Chest: Effort normal and breath sounds normal. No respiratory distress. She has no decreased breath sounds. She has no wheezes. She has no rhonchi. She has no rales.  Neurological: She is alert and oriented to person, place, and time. She has normal strength. No sensory deficit. She displays a negative Romberg sign. Coordination and gait normal.  Nonfocal, no pronator drift   Skin: Skin is warm and dry. No rash noted.  Psychiatric: She has a normal mood and affect. Her behavior is normal.  Vitals reviewed.  Filed Vitals:   02/16/15 1232  BP: 120/82  Pulse: 78  Temp: 98.1 F (36.7 C)  Resp: 16      Assessment & Plan:   Michele Douglas is a 24 y.o. female Nausea without vomiting - Plan: Ambulatory referral to Neurology Dizziness - Plan: Ambulatory referral to Neurology Papilledema - Plan: Ambulatory referral to Neurology  - intermittent symptoms, but borderline papilledema. ddx includes pseudotumor cerebri.  No concerning findings on exam, nonfocal neuro exam.  Will refer to neuro for eval, but if acute change in sx's prior or worsening - rtc or ER.   Asthma, mild intermittent, uncomplicated - Plan: Albuterol Sulfate (PROAIR RESPICLICK) 108 (90 BASE) MCG/ACT AEPB, beclomethasone (QVAR) 40 MCG/ACT inhaler  - overall stable on prn albuterol.  Prior on [place].  New rx for albuterol given, 1 refill.  - Rx given for Qvar to start during allergy season or if increased need for albuterol indicating decrease in asthma control.   -rtc precautions    Meds ordered this  encounter  Medications  . DISCONTD: albuterol (PROVENTIL HFA;VENTOLIN HFA) 108 (90 BASE) MCG/ACT inhaler    Sig: Inhale into the lungs every 6 (six) hours as needed for wheezing or shortness of breath.  . propranolol ER (INDERAL LA) 60 MG 24 hr capsule    Sig: Take 60 mg by mouth daily.    Refill:  2  . Albuterol Sulfate (PROAIR RESPICLICK) 108 (90 BASE) MCG/ACT AEPB    Sig: Inhale 108 mcg into the lungs every 4 (four) hours as needed.    Dispense:  1 each    Refill:  1  . beclomethasone (QVAR) 40 MCG/ACT inhaler    Sig: Inhale 1-2 puffs into the lungs 2 (two) times daily.    Dispense:  1 Inhaler    Refill:  3   Patient Instructions  Continue albuterol as needed, but if increased need as we discussed - start Qvar 1-2 puffs twice per day (can start at one puff twice per day -  increase to 2 if not controlling symptoms in a week to 10 days). Return to the clinic or go to the nearest emergency room if any of your symptoms worsen or new symptoms occur.  I will refer you to neuro. Return here or ER if worsening symptoms.   Asthma, Adult Asthma is a recurring condition in which the airways tighten and narrow. Asthma can make it difficult to breathe. It can cause coughing, wheezing, and shortness of breath. Asthma episodes, also called asthma attacks, range from minor to life-threatening. Asthma cannot be cured, but medicines and lifestyle changes can help control it. CAUSES Asthma is believed to be caused by inherited (genetic) and environmental factors, but its exact cause is unknown. Asthma may be triggered by allergens, lung infections, or irritants in the air. Asthma triggers are different for each person. Common triggers include:   Animal dander.  Dust mites.  Cockroaches.  Pollen from trees or grass.  Mold.  Smoke.  Air pollutants such as dust, household cleaners, hair sprays, aerosol sprays, paint fumes, strong chemicals, or strong odors.  Cold air, weather changes, and winds  (which increase molds and pollens in the air).  Strong emotional expressions such as crying or laughing hard.  Stress.  Certain medicines (such as aspirin) or types of drugs (such as beta-blockers).  Sulfites in foods and drinks. Foods and drinks that may contain sulfites include dried fruit, potato chips, and sparkling grape juice.  Infections or inflammatory conditions such as the flu, a cold, or an inflammation of the nasal membranes (rhinitis).  Gastroesophageal reflux disease (GERD).  Exercise or strenuous activity. SYMPTOMS Symptoms may occur immediately after asthma is triggered or many hours later. Symptoms include:  Wheezing.  Excessive nighttime or early morning coughing.  Frequent or severe coughing with a common cold.  Chest tightness.  Shortness of breath. DIAGNOSIS  The diagnosis of asthma is made by a review of your medical history and a physical exam. Tests may also be performed. These may include:  Lung function studies. These tests show how much air you breathe in and out.  Allergy tests.  Imaging tests such as X-rays. TREATMENT  Asthma cannot be cured, but it can usually be controlled. Treatment involves identifying and avoiding your asthma triggers. It also involves medicines. There are 2 classes of medicine used for asthma treatment:   Controller medicines. These prevent asthma symptoms from occurring. They are usually taken every day.  Reliever or rescue medicines. These quickly relieve asthma symptoms. They are used as needed and provide short-term relief. Your health care provider will help you create an asthma action plan. An asthma action plan is a written plan for managing and treating your asthma attacks. It includes a list of your asthma triggers and how they may be avoided. It also includes information on when medicines should be taken and when their dosage should be changed. An action plan may also involve the use of a device called a peak flow  meter. A peak flow meter measures how well the lungs are working. It helps you monitor your condition. HOME CARE INSTRUCTIONS   Take medicines only as directed by your health care provider. Speak with your health care provider if you have questions about how or when to take the medicines.  Use a peak flow meter as directed by your health care provider. Record and keep track of readings.  Understand and use the action plan to help minimize or stop an asthma attack without needing to seek medical  care.  Control your home environment in the following ways to help prevent asthma attacks:  Do not smoke. Avoid being exposed to secondhand smoke.  Change your heating and air conditioning filter regularly.  Limit your use of fireplaces and wood stoves.  Get rid of pests (such as roaches and mice) and their droppings.  Throw away plants if you see mold on them.  Clean your floors and dust regularly. Use unscented cleaning products.  Try to have someone else vacuum for you regularly. Stay out of rooms while they are being vacuumed and for a short while afterward. If you vacuum, use a dust mask from a hardware store, a double-layered or microfilter vacuum cleaner bag, or a vacuum cleaner with a HEPA filter.  Replace carpet with wood, tile, or vinyl flooring. Carpet can trap dander and dust.  Use allergy-proof pillows, mattress covers, and box spring covers.  Wash bed sheets and blankets every week in hot water and dry them in a dryer.  Use blankets that are made of polyester or cotton.  Clean bathrooms and kitchens with bleach. If possible, have someone repaint the walls in these rooms with mold-resistant paint. Keep out of the rooms that are being cleaned and painted.  Wash hands frequently. SEEK MEDICAL CARE IF:   You have wheezing, shortness of breath, or a cough even if taking medicine to prevent attacks.  The colored mucus you cough up (sputum) is thicker than usual.  Your sputum  changes from clear or white to yellow, green, gray, or bloody.  You have any problems that may be related to the medicines you are taking (such as a rash, itching, swelling, or trouble breathing).  You are using a reliever medicine more than 2-3 times per week.  Your peak flow is still at 50-79% of your personal best after following your action plan for 1 hour.  You have a fever. SEEK IMMEDIATE MEDICAL CARE IF:   You seem to be getting worse and are unresponsive to treatment during an asthma attack.  You are short of breath even at rest.  You get short of breath when doing very little physical activity.  You have difficulty eating, drinking, or talking due to asthma symptoms.  You develop chest pain.  You develop a fast heartbeat.  You have a bluish color to your lips or fingernails.  You are light-headed, dizzy, or faint.  Your peak flow is less than 50% of your personal best.   This information is not intended to replace advice given to you by your health care provider. Make sure you discuss any questions you have with your health care provider.   Document Released: 02/15/2005 Document Revised: 11/06/2014 Document Reviewed: 09/14/2012 Elsevier Interactive Patient Education [employer]

## 2015-02-18 ENCOUNTER — Other Ambulatory Visit: Payer: Self-pay

## 2015-02-18 DIAGNOSIS — J4520 Mild intermittent asthma, uncomplicated: Secondary | ICD-10-CM

## 2015-02-18 MED ORDER — ALBUTEROL SULFATE HFA 108 (90 BASE) MCG/ACT IN AERS: 1.0000 | INHALATION_SPRAY | RESPIRATORY_TRACT | Status: DC | PRN

## 2015-02-18 NOTE — Telephone Encounter (Signed)
Pharm sent notice that ins doesn't cover ProAir w/out PA. According to online formulary, Ventolin is preferred. No notes in OV notes that Dr Neva SeatGreene intended pt to have any specific brand of albuterol, so will OK filling w/whatever albuterol is covered by ins.

## 2015-02-20 ENCOUNTER — Ambulatory Visit (INDEPENDENT_AMBULATORY_CARE_PROVIDER_SITE_OTHER): Payer: 59 | Admitting: Neurology

## 2015-02-20 ENCOUNTER — Encounter: Payer: Self-pay | Admitting: Neurology

## 2015-02-20 VITALS — BP 128/82 | HR 79 | Ht 67.0 in | Wt 275.0 lb

## 2015-02-20 DIAGNOSIS — G43009 Migraine without aura, not intractable, without status migrainosus: Secondary | ICD-10-CM | POA: Diagnosis not present

## 2015-02-20 DIAGNOSIS — H4710 Unspecified papilledema: Secondary | ICD-10-CM | POA: Diagnosis not present

## 2015-02-20 NOTE — Progress Notes (Signed)
Reason for visit: Headache  Referring physician: Dr. Carolann Littler Michele Douglas is a 24 y.o. female  History of present illness:  [name] is a 24 year old right-handed black female with a history of a benign essential tremor. The patient has been followed through [facility] for this by a neurologist. The patient has been treated with propranolol. The tremor is fairly minimal, only involves the arms, not the head or neck or the voice. The patient indicates that she has had issues with weight gain over the last year, she has gained 40 pounds. She began developing intermittent episodes of dizziness and nausea that began in April 2016. The patient underwent MRI brain evaluation with and without gadolinium enhancement, and MRA of the head at that time through [facility] which was unremarkable. Within the last 2 months, the patient has begun having some right frontotemporal headache and occipital headaches that occur on average about 6 times a month. The patient takes over-the-counter medication such as Advil for this with good improvement, lying down and sleeping may help the headache as well. The patient does report photophobia and phonophobia with the headache. She has a family history of migraine in the mother and a sister. She denies any focal numbness or weakness of the face, arms, or legs. She indicates that working on the computer and seeing flashing lights may worsen the nausea and dizziness. The patient went to her optometrist for routine eye examination on 01/27/2015, and she was noted to have mild bilateral papilledema. The patient herself denies any neck stiffness or changes in hearing. She is sent to this office for further evaluation. She is not on birth control pills. She denies any issues controlling the bowels or the bladder, and she denies any balance issues.  Past Medical History  Diagnosis Date  . GERD (gastroesophageal reflux disease)   . Lactose intolerance   . Asthma     . Essential tremor   . 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 Tremor   . Common migraine 02/20/2015    Past Surgical History  Procedure Laterality Date  . Tympanoplasty      left x 2  . Breast surgery  2006    cystectomy    Family History  Problem Relation Age of Onset  . Hypertension Mother   . Migraines Mother   . AAA (abdominal aortic aneurysm) Father   . Migraines Sister   . Myasthenia gravis Sister     Social history:  reports that she has never smoked. She has never used smokeless tobacco. She reports that she drinks alcohol. She reports that she does not use illicit drugs.  Medications:  Prior to Admission medications   Medication Sig Start Date End Date Taking? Authorizing Provider  albuterol (PROVENTIL HFA;VENTOLIN HFA) 108 (90 BASE) MCG/ACT inhaler Inhale 1-2 puffs into the lungs every 4 (four) hours as needed. 02/18/15  Yes Shade Flood, MD  beclomethasone (QVAR) 40 MCG/ACT inhaler Inhale 1-2 puffs into the lungs 2 (two) times daily. 02/16/15  Yes Shade Flood, MD  propranolol ER (INDERAL LA) 60 MG 24 hr capsule Take 60 mg by mouth daily. 01/27/15  Yes Historical Provider, MD  triamcinolone cream (KENALOG) 0.1 % Apply 1 application topically 2 (two) times daily. 08/23/14  Yes Shade Flood, MD      Allergies  Allergen Reactions  . Lactose Other (See Comments)    Sick very very bad   .  Ibuprofen Nausea And Vomiting    ROS:  Out of a complete 14 system review of symptoms, the patient complains only of the following symptoms, and all other reviewed systems are negative.  Dizziness Allergies, runny nose Headache, dizziness  Blood pressure 128/82, pulse 79, height 5\' 7"  (1.702 m), weight 275 lb (124.739 kg).  Physical Exam  General: The patient is alert and cooperative at the time of the examination. The patient is moderately obese.  Eyes: Pupils are equal, round, and  reactive to light. Discs are flat bilaterally.  Neck: The neck is supple, no carotid bruits are noted.  Respiratory: The respiratory examination is clear.  Cardiovascular: The cardiovascular examination reveals a regular rate and rhythm, no obvious murmurs or rubs are noted.  Neuromuscular: Range of movement of the cervical spine is full. No crepitus is noted in the temporomandibular joints.  Skin: Extremities are without significant edema.  Neurologic Exam  Mental status: The patient is alert and oriented x 3 at the time of the examination. The patient has apparent normal recent and remote memory, with an apparently normal attention span and concentration ability.  Cranial nerves: Facial symmetry is present. There is good sensation of the face to pinprick and soft touch bilaterally. The strength of the facial muscles and the muscles to head turning and shoulder shrug are normal bilaterally. Speech is well enunciated, no aphasia or dysarthria is noted. Extraocular movements are full. Visual fields are full. The tongue is midline, and the patient has symmetric elevation of the soft palate. No obvious hearing deficits are noted.  Motor: The motor testing reveals 5 over 5 strength of all 4 extremities. Good symmetric motor tone is noted throughout.  Sensory: Sensory testing is intact to pinprick, soft touch, vibration sensation, and position sense on all 4 extremities. No evidence of extinction is noted.  Coordination: Cerebellar testing reveals good finger-nose-finger and heel-to-shin bilaterally.  Gait and station: Gait is normal. Tandem gait is normal. Romberg is negative. No drift is seen.  Reflexes: Deep tendon reflexes are symmetric and normal bilaterally. Toes are downgoing bilaterally.   MRI brain/MRA head 06/14/14:  IMPRESSION: 1. Normal brain MR.  2. Normal MRA of the brain.   Assessment/Plan:  1. Probable common migraine headache  2. Mild bilateral papilledema  3.  Obesity  4. Chronic nausea, dizziness  The patient has a history of headache that is most consistent with migraine. There is some concern for pseudotumor cerebri as the patient has had a significant recent weight gain, and has mild bilateral papilledema on ophthalmologic evaluation. The patient could potentially have preclinical pseudotumor cerebri. The patient will be sent for a lumbar puncture, if the opening pressure is elevated, we will initiate treatment. The patient may benefit from the use of Topamax in the future for her migraine, weight loss, and for her tremor. The patient will follow-up in 4 months.  Michele Douglas. Michele Blaise Grieshaber MD 02/21/2015 9:28 AM  [facility] [address] [place], [place] [id number]  Phone [phone] Fax [phone]

## 2015-02-20 NOTE — Patient Instructions (Addendum)
   We will check LP (spinal tap) to check the pressure of the spinal fluid.   Migraine Headache A migraine headache is an intense, throbbing pain on one or both sides of your head. A migraine can last for 30 minutes to several hours. CAUSES  The exact cause of a migraine headache is not always known. However, a migraine may be caused when nerves in the brain become irritated and release chemicals that cause inflammation. This causes pain. Certain things may also trigger migraines, such as:  Alcohol.  Smoking.  Stress.  Menstruation.  Aged cheeses.  Foods or drinks that contain nitrates, glutamate, aspartame, or tyramine.  Lack of sleep.  Chocolate.  Caffeine.  Hunger.  Physical exertion.  Fatigue.  Medicines used to treat chest pain (nitroglycerine), birth control pills, estrogen, and some blood pressure medicines. SIGNS AND SYMPTOMS  Pain on one or both sides of your head.  Pulsating or throbbing pain.  Severe pain that prevents daily activities.  Pain that is aggravated by any physical activity.  Nausea, vomiting, or both.  Dizziness.  Pain with exposure to bright lights, loud noises, or activity.  General sensitivity to bright lights, loud noises, or smells. Before you get a migraine, you may get warning signs that a migraine is coming (aura). An aura may include:  Seeing flashing lights.  Seeing bright spots, halos, or zigzag lines.  Having tunnel vision or blurred vision.  Having feelings of numbness or tingling.  Having trouble talking.  Having muscle weakness. DIAGNOSIS  A migraine headache is often diagnosed based on:  Symptoms.  Physical exam.  A CT scan or MRI of your head. These imaging tests cannot diagnose migraines, but they can help rule out other causes of headaches. TREATMENT Medicines may be given for pain and nausea. Medicines can also be given to help prevent recurrent migraines.  HOME CARE INSTRUCTIONS  Only take  over-the-counter or prescription medicines for pain or discomfort as directed by your health care provider. The use of long-term narcotics is not recommended.  Lie down in a dark, quiet room when you have a migraine.  Keep a journal to find out what may trigger your migraine headaches. For example, write down:  What you eat and drink.  How much sleep you get.  Any change to your diet or medicines.  Limit alcohol consumption.  Quit smoking if you smoke.  Get 7-9 hours of sleep, or as recommended by your health care provider.  Limit stress.  Keep lights dim if bright lights bother you and make your migraines worse. SEEK IMMEDIATE MEDICAL CARE IF:   Your migraine becomes severe.  You have a fever.  You have a stiff neck.  You have vision loss.  You have muscular weakness or loss of muscle control.  You start losing your balance or have trouble walking.  You feel faint or pass out.  You have severe symptoms that are different from your first symptoms. MAKE SURE YOU:   Understand these instructions.  Will watch your condition.  Will get help right away if you are not doing well or get worse.   This information is not intended to replace advice given to you by your health care provider. Make sure you discuss any questions you have with your health care provider.   Document Released: 02/15/2005 Document Revised: 03/08/2014 Document Reviewed: 10/23/2012 Elsevier Interactive Patient Education [employer]2016 Elsevier Inc.

## 2015-03-05 ENCOUNTER — Telehealth: Payer: Self-pay | Admitting: Neurology

## 2015-03-05 ENCOUNTER — Other Ambulatory Visit: Payer: 59

## 2015-03-05 NOTE — Telephone Encounter (Signed)
The patient is calling. She is scheduled [facility]. She would [facility] to [facility] because of her insurance. Please call and discuss. She has not cancelled her Spinal Tap appointment yet.

## 2015-03-06 NOTE — Telephone Encounter (Signed)
I spoke to pt and she has [facility], and wanted to have there, vs here at [facility].  Has BCBS now , stated somehow she would be able to get financial assistance.  I told her I would let our referral person know and she will check with [facility] radiology in regards to setting this up.  Pt verbalized understanding.  She is available at the number below to contact.

## 2015-03-06 NOTE — Telephone Encounter (Signed)
Pt returned call. Please call and advise °

## 2015-03-06 NOTE — Telephone Encounter (Signed)
LMVM for pt that returned her call. Please call back.

## 2015-03-07 NOTE — Telephone Encounter (Signed)
Gave information to [place] in referrals to f/u.

## 2015-03-12 NOTE — Telephone Encounter (Signed)
Spoke with the patient and advised her we would send referral [facility] for [facility] Lum[facility] and would call her back to confirm it was sent.

## 2015-03-13 NOTE — Telephone Encounter (Signed)
Called and left Michele Douglas a message about scheduling a Lumbar Puncture [phone] . I will also need a fax number to send order and notes.

## 2015-03-14 ENCOUNTER — Inpatient Hospital Stay: Admission: RE | Admit: 2015-03-14 | Payer: Self-pay | Source: Ambulatory Visit

## 2015-03-17 NOTE — Telephone Encounter (Signed)
Called patient and left her a message asking her to call me back following up from her [facility] apt.

## 2015-03-18 NOTE — Telephone Encounter (Signed)
Called patient relayed all paper work has been faxed to [employer] will call her for apt. Patient understood.

## 2015-03-20 ENCOUNTER — Telehealth: Payer: Self-pay | Admitting: Neurology

## 2015-03-20 NOTE — Telephone Encounter (Signed)
Called patient and asked her to call me back about her LP. [facility] do the LP's a little different and I was wondering if she wanted to stay local.

## 2015-03-24 NOTE — Telephone Encounter (Signed)
Because Of insurance patient has to be scheduled at [facility] for LP. I will get patient scheduled.

## 2015-03-25 NOTE — Telephone Encounter (Signed)
Lumbar Puncture

## 2015-03-25 NOTE — Telephone Encounter (Signed)
Called [facility] Relayed for me to Fax order to scheduling and they will call and get patient scheduled . Telephone [phone] fax [phone].

## 2015-04-03 ENCOUNTER — Telehealth: Payer: Self-pay | Admitting: Neurology

## 2015-04-03 NOTE — Telephone Encounter (Signed)
Pt called and states she has been referred to [facility] for continuation of care, however she is being told that additional paper work is needed. Please call the pt when this has been sent.

## 2015-04-03 NOTE — Telephone Encounter (Signed)
Called and spoke to patient. Patient has been scheduled with [facility] . Patient relayed [facility] needed another form but she was not sure what kind of form . I called [facility] and left a message at [phone]. For [facility] to call me back.

## 2015-04-08 NOTE — Telephone Encounter (Signed)
Called [facility] x2 and left them another message asking them if they had all information they needed for apt.

## 2015-04-08 NOTE — Telephone Encounter (Signed)
Spoke to patient she is scheduled with [facility] on 04/17/2015. For her lumbar puncture.

## 2015-04-17 ENCOUNTER — Telehealth: Payer: Self-pay | Admitting: Neurology

## 2015-04-17 MED ORDER — ACETAZOLAMIDE 125 MG PO TABS: ORAL_TABLET | ORAL | Status: DC

## 2015-04-17 NOTE — Telephone Encounter (Signed)
I called the patient. The lumbar puncture done does show an elevated opening pressure of 36.1 cm. We will start the patient on Diamox.

## 2015-04-21 ENCOUNTER — Ambulatory Visit (INDEPENDENT_AMBULATORY_CARE_PROVIDER_SITE_OTHER): Payer: BLUE CROSS/BLUE SHIELD | Admitting: Emergency Medicine

## 2015-04-21 ENCOUNTER — Other Ambulatory Visit: Payer: Self-pay | Admitting: Neurology

## 2015-04-21 ENCOUNTER — Ambulatory Visit
Admission: RE | Admit: 2015-04-21 | Discharge: 2015-04-21 | Disposition: A | Discharge status: Home / Self Care | Payer: BLUE CROSS/BLUE SHIELD | Source: Ambulatory Visit | Attending: Neurology | Admitting: Neurology

## 2015-04-21 ENCOUNTER — Telehealth: Payer: Self-pay | Admitting: Neurology

## 2015-04-21 VITALS — BP 122/84 | HR 83 | Temp 98.3°F | Resp 16 | Ht 67.5 in | Wt 260.0 lb

## 2015-04-21 DIAGNOSIS — G971 Other reaction to spinal and lumbar puncture: Secondary | ICD-10-CM

## 2015-04-21 DIAGNOSIS — H4710 Unspecified papilledema: Secondary | ICD-10-CM | POA: Diagnosis not present

## 2015-04-21 DIAGNOSIS — G932 Benign intracranial hypertension: Secondary | ICD-10-CM | POA: Diagnosis not present

## 2015-04-21 MED ORDER — CYCLOBENZAPRINE HCL 5 MG PO TABS: ORAL_TABLET | ORAL | Status: DC

## 2015-04-21 MED ORDER — IOHEXOL 180 MG/ML  SOLN
1.0000 mL | Freq: Once | INTRAMUSCULAR | Status: AC | PRN
  Administered 2015-04-21: 1 mL via EPIDURAL

## 2015-04-21 NOTE — Telephone Encounter (Signed)
I called and spoke to [place] at [facility]. They have an opening at 11:15 today for the blood patch. She asked me to let the patient know and to tell the patient to come to [address] I called the patient and informed her of this.

## 2015-04-21 NOTE — Addendum Note (Signed)
Addended by: Lesle Chris A on: 04/21/2015 09:15 AM   Modules accepted: Orders

## 2015-04-21 NOTE — Patient Instructions (Signed)
I have contacted Dr. Anne Hahn. He will call you this afternoon and tell you when you can go to interventional radiology for a blood patch. Please stay flat at home with the heating pad on his shoulders.

## 2015-04-21 NOTE — Progress Notes (Signed)
Chief Complaint:  Chief Complaint  Patient presents with  . Migraine    x 4 days    HPI: Michele Douglas is a 25 y.o. female who is here for a severe bitemporal and posterior cervical headache. Of note patient has a history of pseudotumor cerebri. On Thursday she went to [facility] and had a spinal performed her opening pressure was elevated at 36. Normal pressure is up to 20. That evening she started developing severe posterior cervical headaches when she was upright. She states as long as she lays flat she is comfortable. She is not able to be up for any period of time. Her spinal puncture was performed at [facility] but ordered by Dr. Anne Hahn here in [place].  Past Medical History  Diagnosis Date  . GERD (gastroesophageal reflux disease)   . Lactose intolerance   . Asthma   . Essential tremor   . 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 Tremor   . Common migraine 02/20/2015   Past Surgical History  Procedure Laterality Date  . Tympanoplasty      left x 2  . Breast surgery  2006    cystectomy   Social History   Social History  . Marital Status: Single    Spouse Name: N/A  . Number of Children: 0  . Years of Education: college   Social History Main Topics  . Smoking status: Never Smoker   . Smokeless tobacco: Never Used  . Alcohol Use: 0.0 oz/week    0 Standard drinks or equivalent per week     Comment: mixed drink ocassionally  . Drug Use: No  . Sexual Activity: Not Currently    Birth Control/ Protection: None     Comment: 1st intercourse 25 yo-Fewer than 5 partners   Other Topics Concern  . None   Social History Narrative   Patient drinks caffeine occasionally.   Patient is right handed.    Family History  Problem Relation Age of Onset  . Hypertension Mother   . Migraines Mother   . AAA (abdominal aortic aneurysm) Father   . Migraines Sister   . Myasthenia gravis Sister    Allergies    Allergen Reactions  . Lactose Other (See Comments)    Sick very very bad   . Ibuprofen Nausea And Vomiting   Prior to Admission medications   Medication Sig Start Date End Date Taking? Authorizing Provider  albuterol (PROVENTIL HFA;VENTOLIN HFA) 108 (90 BASE) MCG/ACT inhaler Inhale 1-2 puffs into the lungs every 4 (four) hours as needed. 02/18/15  Yes Shade Flood, MD  beclomethasone (QVAR) 40 MCG/ACT inhaler Inhale 1-2 puffs into the lungs 2 (two) times daily. 02/16/15  Yes Shade Flood, MD  propranolol ER (INDERAL LA) 60 MG 24 hr capsule Take 60 mg by mouth daily. 01/27/15  Yes Historical Provider, MD  acetaZOLAMIDE (DIAMOX) 125 MG tablet 1 tablet twice daily for 3 weeks, then take 2 tablets twice daily Patient not taking: Reported on 04/21/2015 04/17/15   York Spaniel, MD  triamcinolone cream (KENALOG) 0.1 % Apply 1 application topically 2 (two) times daily. Patient not taking: Reported on 04/21/2015 08/23/14   Shade Flood, MD     ROS: The patient denies fevers, chills, night sweats, unintentional weight loss, chest pain, palpitations, wheezing, dyspnea on exertion, nausea, vomiting, abdominal pain, dysuria, hematuria, melena, numbness, weakness, or tingling  All other systems have been reviewed  and were otherwise negative with the exception of those mentioned in the HPI and as above.    PHYSICAL EXAM: Filed Vitals:   04/21/15 0754  BP: 122/84  Pulse: 83  Temp: 98.3 F (36.8 C)  Resp: 16   Filed Vitals:   04/21/15 0754  Height: 5' 7.5" (1.715 m)  Weight: 260 lb (117.935 kg)   Body mass index is 40.1 kg/(m^2).   General: Alert, no acute distress HEENT:  Normocephalic, atraumatic, oropharynx patent. EOMI, PERRLA Cardiovascular:  Regular rate and rhythm, no rubs murmurs or gallops.  No Carotid bruits, radial pulse intact. No pedal edema.  Respiratory: Clear to auscultation bilaterally.  No wheezes, rales, or rhonchi.  No cyanosis, no use of accessory  musculature GI: No organomegaly, abdomen is soft and non-tender, positive bowel sounds.  No masses. Skin: No rashes. Neurologic: Facial musculature symmetric. When patient sits upright she complains of severe frontal headache. She does have photophobia and pain with any movement of her eyes. Motor strength is 5 out of 5 all muscle groups. Cranial nerves are intact. Psychiatric: Patient is appropriate throughout our interaction. Lymphatic: No cervical lymphadenopathy Musculoskeletal: Gait intact.   LABS: Results for orders placed or performed in visit on 09/03/14  D-dimer, quantitative (not at [facility])  Result Value Ref Range   D-Dimer, Quant 0.27 0.00 - 0.48 ug/mL-FEU     EKG/XRAY:   Primary read interpreted by Dr. Cleta Alberts at [facility].   ASSESSMENT/PLAN: Patient with a  post LP headache. I discussed her case with Dr. Anne Hahn. He will arrange for patient to have a blood patch. She is instructed to go home stay flat until she can go for her blood patch procedure. Lucilla Edin M.D.   Gross sideeffects, risk and benefits, and alternatives of medications d/w patient. Patient is aware that all medications have potential sideeffects and we are unable to predict every sideeffect or drug-drug interaction that may occur.  @ 04/21/2015 8:33 AM

## 2015-04-21 NOTE — Telephone Encounter (Signed)
[facility] , Dr. Cleta Alberts would like to speak with Dr. Anne Hahn about pts post spinal tap headache. Pt is at the office right now. Please call [phone]

## 2015-04-21 NOTE — Progress Notes (Signed)
20cc blood drawn from right [facility] space without difficulty for blood patch; site unremarkable.  jkl

## 2015-04-21 NOTE — Discharge Instructions (Signed)

## 2015-04-21 NOTE — Addendum Note (Signed)
Addended by: Stephanie Acre on: 04/21/2015 08:26 AM   Modules accepted: Orders

## 2015-04-21 NOTE — Telephone Encounter (Signed)
Dr. Cleta Alberts called. The patient has developed a spinal headache. I will get her setup for a blood patch today, the patient will be going home from work.

## 2015-04-23 NOTE — Telephone Encounter (Signed)
Pt called sts she still has dull HA, her eyes are sore- feels like pressure at the top of the eyeball and back neck pain at base of head. The HA has improved since the blood patch but it is constant dull pain. She is inquiring how long will this last and is there anything she can take to help with symptoms.

## 2015-04-23 NOTE — Telephone Encounter (Signed)
I called patient. The spinal headache has improved with the blood patch, but it still continues some mainly with a upper neck and back of the head. The pain is still positional in nature. I would not start the Diamox until this pain goes away. The patient may take Advil, leg as much as possible, drink caffeinated products.

## 2015-04-23 NOTE — Telephone Encounter (Signed)
Pt called back inquiring also in Dr Anne Hahn wants to start acetaZOLAMIDE (DIAMOX) 125 MG tablet .

## 2015-05-02 ENCOUNTER — Telehealth: Payer: Self-pay | Admitting: Neurology

## 2015-05-02 NOTE — Telephone Encounter (Signed)
Pt wants to know if she should still be seen by a neuro-opthomologist. Please call and advise [phone]

## 2015-05-02 NOTE — Telephone Encounter (Signed)
I called the patient. The patient does not need to see a neuro-ophthalmologist, she should see her ophthalmologist twice a year to follow-up with the pseudotumor cerebri.

## 2015-06-06 ENCOUNTER — Telehealth: Payer: Self-pay | Admitting: Neurology

## 2015-06-06 ENCOUNTER — Ambulatory Visit (INDEPENDENT_AMBULATORY_CARE_PROVIDER_SITE_OTHER): Payer: BLUE CROSS/BLUE SHIELD | Admitting: Neurology

## 2015-06-06 ENCOUNTER — Encounter: Payer: Self-pay | Admitting: Neurology

## 2015-06-06 VITALS — BP 112/73 | HR 81 | Ht 67.5 in | Wt 257.0 lb

## 2015-06-06 DIAGNOSIS — G250 Essential tremor: Secondary | ICD-10-CM

## 2015-06-06 DIAGNOSIS — G932 Benign intracranial hypertension: Secondary | ICD-10-CM

## 2015-06-06 DIAGNOSIS — G43009 Migraine without aura, not intractable, without status migrainosus: Secondary | ICD-10-CM

## 2015-06-06 MED ORDER — TOPIRAMATE 25 MG PO TABS: 25.0000 mg | ORAL_TABLET | Freq: Two times a day (BID) | ORAL | Status: DC

## 2015-06-06 NOTE — Telephone Encounter (Signed)
Spoke to pt and I told her that rx escribed to [facility].  She should be goo to go.

## 2015-06-06 NOTE — Telephone Encounter (Signed)
Patient called, was just seen today, Dr. Anne HahnWillis sent a Rx for topiramate (TOPAMAX) 25 MG tablet to [facility], patient found out, this Rx is cheaper at [facility] and would like for this Rx to be sent to [facility] instead.

## 2015-06-06 NOTE — Progress Notes (Signed)
Reason for visit: Pseudotumor cerebri  Michele Douglas is an 25 y.o. female  History of present illness:  [name] is a 25 year old right-handed black female with a history of obesity, pseudotumor cerebri, probable migraine, and essential tremor. The patient has had documentation of elevation of CSF pressure by spinal tap with a pressure of 36.1 cm. The patient has been on Diamox for about 6 weeks, she is having side effects with tingling in the hands and feet, and sensations on the face. The patient has some heartburn from the medication, but her headaches have been eliminated. She is no longer having any dizziness. She denies any vision disturbances or double vision. The patient has been on a diet, working out, she has already lost 20 pounds of the 40 pounds that she gained prior to onset of symptoms. The patient returns for an evaluation. She is taking Zantac for her heartburn.  Past Medical History  Diagnosis Date  . GERD (gastroesophageal reflux disease)   . Lactose intolerance   . Asthma   . Essential tremor   . 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 Tremor   . Common migraine 02/20/2015    Past Surgical History  Procedure Laterality Date  . Tympanoplasty      left x 2  . Breast surgery  2006    cystectomy    Family History  Problem Relation Age of Onset  . Hypertension Mother   . Migraines Mother   . AAA (abdominal aortic aneurysm) Father   . Migraines Sister   . Myasthenia gravis Sister     Social history:  reports that she has never smoked. She has never used smokeless tobacco. She reports that she drinks alcohol. She reports that she does not use illicit drugs.    Allergies  Allergen Reactions  . Lactose Other (See Comments)    Sick very very bad   . Ibuprofen Nausea And Vomiting    Medications:  Prior to Admission medications   Medication Sig Start Date End Date Taking? Authorizing  Provider  ranitidine (ZANTAC) 150 MG tablet Take 150 mg by mouth 2 (two) times daily.   Yes Historical Provider, MD  acetaZOLAMIDE (DIAMOX) 125 MG tablet 1 tablet twice daily for 3 weeks, then take 2 tablets twice daily Patient not taking: Reported on 04/21/2015 04/17/15   York Spanielharles K Nil Xiong, MD  albuterol (PROVENTIL HFA;VENTOLIN HFA) 108 (90 BASE) MCG/ACT inhaler Inhale 1-2 puffs into the lungs every 4 (four) hours as needed. 02/18/15   Shade FloodJeffrey R Greene, MD  beclomethasone (QVAR) 40 MCG/ACT inhaler Inhale 1-2 puffs into the lungs 2 (two) times daily. 02/16/15   Shade FloodJeffrey R Greene, MD  cyclobenzaprine (FLEXERIL) 5 MG tablet Take 1-2 tablets 3 times a day as needed for neck pain 04/21/15   Collene GobbleSteven A Daub, MD  propranolol ER (INDERAL LA) 60 MG 24 hr capsule Take 60 mg by mouth daily. 01/27/15   Historical Provider, MD  triamcinolone cream (KENALOG) 0.1 % Apply 1 application topically 2 (two) times daily. Patient not taking: Reported on 04/21/2015 08/23/14   Shade FloodJeffrey R Greene, MD    ROS:  Out of a complete 14 system review of symptoms, the patient complains only of the following symptoms, and all other reviewed systems are negative.  Decreased appetite, fatigue Eye pain Cold intolerance, numbness and tingling   Blood pressure 112/73, pulse 81, height 5' 7.5" (1.715 m), weight 257 lb (116.574 kg).  Physical Exam  General: The patient is alert and cooperative at the time of the examination. The patient is markedly obese.  Skin: No significant peripheral edema is noted.   Neurologic Exam  Mental status: The patient is alert and oriented x 3 at the time of the examination. The patient has apparent normal recent and remote memory, with an apparently normal attention span and concentration ability.   Cranial nerves: Facial symmetry is present. Speech is normal, no aphasia or dysarthria is noted. Extraocular movements are full. Visual fields are full. Pupils are equal, round, and reactive to light.  Discs are notable for some mild blurring of the medial margins. No hemorrhages are seen.  Motor: The patient has good strength in all 4 extremities.  Sensory examination: Soft touch sensation is symmetric on the face, arms, and legs.  Coordination: The patient has good finger-nose-finger and heel-to-shin bilaterally.  Gait and station: The patient has a normal gait. Tandem gait is normal. Romberg is negative. No drift is seen.  Reflexes: Deep tendon reflexes are symmetric.   Assessment/Plan:  1. Pseudotumor cerebri   2. Essential tremor   3. Migraine headache   The patient is doing better with the headaches on the Diamox, but she is not tolerating the medication secondary to paresthesias and heartburn. We will switch to Topamax taking 25 mg twice daily. If the patient continues to have side effects, she is to contact our office. She will otherwise follow-up in 4 months. She has followed-up with her ophthalmologist in 2 months.   Marlan Palau MD 06/06/2015 6:11 PM  [facility] [address] [place], [place] [address]  Phone [phone] Fax [phone]

## 2015-06-06 NOTE — Telephone Encounter (Signed)
Patient is calling back stating she wants the Rx for topiramate (TOPAMAX) 25 MG tablet sent to [facility] [address] instead of [facility].

## 2015-06-06 NOTE — Patient Instructions (Signed)
Pseudotumor Cerebri  Pseudotumor cerebri, also called idiopathic intracranial hypertension, is a condition that occurs due to increased pressure within your skull.  Although some of the symptoms resemble those of a brain tumor, it is not a brain tumor. Symptoms occur when the increased pressure in your skull compresses brain structures. For example, pressure on the nerve responsible for vision (optic nerve) causes it to swell, resulting in visual symptoms.  Pseudotumor cerebri tends to occur in obese women younger than 26 years of age. However, men and children can also develop this condition.  SYMPTOMS   Symptoms of pseudotumor cerebri occur due to increased pressure within the skull. Symptoms may include:   · Headaches.  · Nausea and vomiting.  · Dizziness.  · High blood pressure.    · Ringing in the ears.  · Double or blurred vision.  · Brief episodes of complete loss of vision.  · Pain in the back, neck, or shoulders.  DIAGNOSIS   Pseudotumor cerebri is diagnosed through:  · A detailed eye exam, which can reveal a swollen optic nerve, as well as identifying issues such as blind spots in the vision.  · An MRI or CT scan to rule out other disorders that can cause similar symptoms, such as brain tumors.  · A spinal tap (lumbar puncture), which can demonstrate increased pressure within the skull.  TREATMENT   There are several ways that pseudotumor cerebri is treated, including:  · Medicines to decrease the production of spinal fluid and lower the pressure within your skull.  · Medicines to prevent or treat headaches.  · Surgery to create an opening in your optic nerve to allow excess fluid to drain out.  · Surgery to place drains (shunts) in your brain to remove excess fluid.  HOME CARE INSTRUCTIONS   · Take all medicines as directed by your health care provider.  · Go to all of your follow-up appointments.  · Lose weight if you are overweight.  SEEK MEDICAL CARE IF:  · Any symptoms come back.  · You develop  trouble with hearing, vision, balance, or your sense of smell.  · You cannot eat or drink what you need.  · You are more weak or tired than usual.    · You are losing weight without trying.  SEEK IMMEDIATE MEDICAL CARE IF:  · You have new symptoms such as vision problems or difficulty walking.    · You have a seizure.    · You have trouble breathing.    · You have a fever.        This information is not intended to replace advice given to you by your health care provider. Make sure you discuss any questions you have with your health care provider.     Document Released: 02/20/2013 Document Reviewed: 02/20/2013  Elsevier Interactive Patient Education ©2016 [employer]

## 2015-06-16 ENCOUNTER — Telehealth: Payer: Self-pay | Admitting: Neurology

## 2015-06-16 MED ORDER — TOPIRAMATE 50 MG PO TABS: 50.0000 mg | ORAL_TABLET | Freq: Two times a day (BID) | ORAL | Status: DC

## 2015-06-16 NOTE — Telephone Encounter (Signed)
I called patient. The patient is having headaches on the Topamax, we will go up on the dose taking 50 mg twice daily, this may be an indication of the Topamax is not as effective as the Diamox.

## 2015-06-16 NOTE — Telephone Encounter (Signed)
Pt called and states that since starting topiramate (TOPAMAX) 25 MG tablet she has had a headache. She says it started two days after starting it. She wants to know if she needs to increase the dosage or change medications. Please call and advise [phone]

## 2015-06-26 ENCOUNTER — Ambulatory Visit: Payer: 59 | Admitting: Neurology

## 2015-06-30 ENCOUNTER — Telehealth: Payer: Self-pay | Admitting: Neurology

## 2015-06-30 MED ORDER — TOPIRAMATE 50 MG PO TABS: ORAL_TABLET | ORAL | Status: DC

## 2015-06-30 NOTE — Telephone Encounter (Signed)
Pt called and says she is till having a headache that radiates down her neck for the last week and a half.Pt also stated some dizziness.  Please call [phone]

## 2015-06-30 NOTE — Telephone Encounter (Signed)
I called, left a message, I will call back later. 

## 2015-06-30 NOTE — Telephone Encounter (Signed)
Called pt back. No answer, left VM mssg to return call. Pt was instructed to increase Topamax to 50 mg BID on 06/16/15. Continues to c/o HA, neck pain and dizziness.

## 2015-06-30 NOTE — Telephone Encounter (Signed)
I called patient. She is having headaches primarily in the morning, later in the day they are better, but she still has a bandlike sensation around the head. We will continue to go up on the Topamax taking 100 mg in the evening, 50 mg in the morning. She is tolerating the Topamax well.

## 2015-06-30 NOTE — Telephone Encounter (Signed)
Pt returned back . She was told Dr. Anne HahnWillis will call her back later. She stated she is at work and may not be able to answer.

## 2015-07-10 MED ORDER — TOPIRAMATE 100 MG PO TABS: 100.0000 mg | ORAL_TABLET | Freq: Two times a day (BID) | ORAL | Status: DC

## 2015-07-10 NOTE — Addendum Note (Signed)
Addended by: Stephanie AcreWILLIS, Doree Kuehne on: 07/10/2015 05:19 PM   Modules accepted: Orders, Medications

## 2015-07-10 NOTE — Telephone Encounter (Signed)
The patient feels better on 150 mg of Topamax daily, still having some headaches, we'll go to 100 mg twice daily of the medication.

## 2015-07-10 NOTE — Telephone Encounter (Signed)
Patient reports the HA's have gotten much better but she still has a little bit of one in morning, goes away after she takes medication, then it returns in evening, goes away after she take medication at nighttime.  Advised that the nurse would call

## 2015-07-23 MED ORDER — POTASSIUM CHLORIDE ER 10 MEQ PO TBCR: 10.0000 meq | EXTENDED_RELEASE_TABLET | Freq: Two times a day (BID) | ORAL | Status: DC

## 2015-07-23 MED ORDER — FUROSEMIDE 20 MG PO TABS: 20.0000 mg | ORAL_TABLET | Freq: Every day | ORAL | Status: DC

## 2015-07-23 NOTE — Addendum Note (Signed)
Addended by: Stephanie AcreWILLIS, CHARLES on: 07/23/2015 06:44 PM   Modules accepted: Orders

## 2015-07-23 NOTE — Telephone Encounter (Signed)
Pt called in and does not believe the medication is working for her. She states she is getting symptoms again. Headaches for the last week and a half. It is becoming more consistent

## 2015-07-23 NOTE — Telephone Encounter (Signed)
I called patient. She is having some return of the headache, I will add Lasix 20 mg in the morning, and some potassium supplementation. I will get a revisit in about 3 weeks.

## 2015-07-24 NOTE — Telephone Encounter (Signed)
Returned pt TC. F/u appt scheduled 6/9 @ 9:30 a.m. Pt agreed to arrive 15 min prior to appt time.

## 2015-07-24 NOTE — Telephone Encounter (Signed)
Patient called, would prefer low dose of ACETAZOLAMIDE (has taken before) instead of LASIX.

## 2015-07-25 NOTE — Telephone Encounter (Signed)
I called patient. The patient was tried on Diamox previously, she could not tolerate a low dose of this medication, she only got up to 250 mg twice daily. We will stay on the Topamax, use the Lasix along with it. She will call me if the headaches continue.

## 2015-07-25 NOTE — Telephone Encounter (Signed)
Patient is calling back to discuss as below note that she would prefer a low dose of Acetazolamide instead of Lasik. She says a call was returned to her yesterday but did not discuss this.

## 2015-08-07 ENCOUNTER — Ambulatory Visit (INDEPENDENT_AMBULATORY_CARE_PROVIDER_SITE_OTHER): Payer: BLUE CROSS/BLUE SHIELD | Admitting: Neurology

## 2015-08-07 ENCOUNTER — Encounter: Payer: Self-pay | Admitting: Neurology

## 2015-08-07 VITALS — BP 114/80 | HR 56 | Ht 67.5 in | Wt 250.0 lb

## 2015-08-07 DIAGNOSIS — G43009 Migraine without aura, not intractable, without status migrainosus: Secondary | ICD-10-CM | POA: Diagnosis not present

## 2015-08-07 DIAGNOSIS — G250 Essential tremor: Secondary | ICD-10-CM | POA: Diagnosis not present

## 2015-08-07 DIAGNOSIS — Z5181 Encounter for therapeutic drug level monitoring: Secondary | ICD-10-CM

## 2015-08-07 DIAGNOSIS — G932 Benign intracranial hypertension: Secondary | ICD-10-CM

## 2015-08-07 MED ORDER — ONDANSETRON HCL 4 MG PO TABS: 4.0000 mg | ORAL_TABLET | Freq: Three times a day (TID) | ORAL | Status: DC | PRN

## 2015-08-07 MED ORDER — TOPIRAMATE 100 MG PO TABS: ORAL_TABLET | ORAL | Status: DC

## 2015-08-07 NOTE — Progress Notes (Signed)
Reason for visit: Pseudotumor cerebri  Michele Douglas is an 25 y.o. female  History of present illness:  Michele Douglas is a 25 year old right-handed black female with a history of obesity, migraine headache, and pseudotumor cerebri. The patient has not been able to tolerate Diamox secondary to stomach upset and nausea even on a low dose. She was placed on Topamax which was better tolerated, but the patient is now having similar side effects on a 100 mg twice daily dose. She is having dizziness, nausea, and cognitive slowing on this dose. The patient has not felt well over the last 2 weeks. The patient has difficulty concentrating while at work. She has had increased fatigue, excessive daytime drowsiness. She has been placed on Lasix and potassium supplementation as the headaches were returning. She has done better with the headaches, she has had only one left occipital headache over the last 2 weeks. She is concerned about the other side effects, however. She returns to this office for an evaluation. She does report some occasional neck stiffness. She will be seeing a neuro-ophthalmologist within the next week.  Past Medical History  Diagnosis Date  . GERD (gastroesophageal reflux disease)   . Lactose intolerance   . Asthma   . Essential tremor   . 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 Tremor   . Common migraine 02/20/2015    Past Surgical History  Procedure Laterality Date  . Tympanoplasty      left x 2  . Breast surgery  2006    cystectomy    Family History  Problem Relation Age of Onset  . Hypertension Mother   . Migraines Mother   . AAA (abdominal aortic aneurysm) Father   . Migraines Sister   . Myasthenia gravis Sister     Social history:  reports that she has never smoked. She has never used smokeless tobacco. She reports that she drinks alcohol. She reports that she does not use illicit drugs.      Allergies  Allergen Reactions  . Lactose Other (See Comments)    Sick very very bad   . Ibuprofen Nausea And Vomiting    Medications:  Prior to Admission medications   Medication Sig Start Date End Date Taking? Authorizing Provider  albuterol (PROVENTIL HFA;VENTOLIN HFA) 108 (90 BASE) MCG/ACT inhaler Inhale 1-2 puffs into the lungs every 4 (four) hours as needed. 02/18/15  Yes Shade Flood, MD  beclomethasone (QVAR) 40 MCG/ACT inhaler Inhale 1-2 puffs into the lungs 2 (two) times daily. 02/16/15  Yes Shade Flood, MD  cyclobenzaprine (FLEXERIL) 5 MG tablet Take 1-2 tablets 3 times a day as needed for neck pain 04/21/15  Yes Collene Gobble, MD  furosemide (LASIX) 20 MG tablet Take 1 tablet (20 mg total) by mouth daily. 07/23/15  Yes York Spaniel, MD  potassium chloride (K-DUR) 10 MEQ tablet Take 1 tablet (10 mEq total) by mouth 2 (two) times daily. 07/23/15  Yes York Spaniel, MD  propranolol ER (INDERAL LA) 60 MG 24 hr capsule Take 60 mg by mouth daily. 01/27/15  Yes Historical Provider, MD  ranitidine (ZANTAC) 150 MG tablet Take 150 mg by mouth 2 (two) times daily.   Yes Historical Provider, MD  topiramate (TOPAMAX) 100 MG tablet Take 1 tablet (100 mg total) by mouth 2 (two) times daily. 07/10/15  Yes York Spaniel, MD  triamcinolone cream (KENALOG) 0.1 % Apply  1 application topically 2 (two) times daily. 08/23/14  Yes Shade FloodJeffrey R Greene, MD    ROS:  Out of a complete 14 system review of symptoms, the patient complains only of the following symptoms, and all other reviewed systems are negative.  Fatigue Achy muscles, neck stiffness Decreased concentration  Blood pressure 114/80, pulse 56, height 5' 7.5" (1.715 m), weight 250 lb (113.399 kg).  Physical Exam  General: The patient is alert and cooperative at the time of the examination. The patient is markedly obese.  Skin: No significant peripheral edema is noted.   Neurologic Exam  Mental status: The patient is  alert and oriented x 3 at the time of the examination. The patient has apparent normal recent and remote memory, with an apparently normal attention span and concentration ability.   Cranial nerves: Facial symmetry is present. Speech is normal, no aphasia or dysarthria is noted. Extraocular movements are full. Visual fields are full. Pupils are equal, round, and reactive to light. Discs show minimal blurring medially, no venous pulsations are seen. No hemorrhages are noted.  Motor: The patient has good strength in all 4 extremities.  Sensory examination: Soft touch sensation is symmetric on the face, arms, and legs.  Coordination: The patient has good finger-nose-finger and heel-to-shin bilaterally.  Gait and station: The patient has a normal gait. Tandem gait is normal. Romberg is negative. No drift is seen.  Reflexes: Deep tendon reflexes are symmetric.   Assessment/Plan:  1. Pseudotumor cerebri  2. Migraine headache  3. Obesity  The patient is having significant side effects on the medications with decreased concentration, dizziness, and nausea. We will reduce the Topamax dosing to 50 mg in the morning, 100 mg in the evening. She will remain on Lasix and potassium. Blood work will be done today. She will follow-up for her next scheduled visit on 10/08/2015. A prescription was given for Zofran for nausea if she needs it. The patient continues to lose some weight, she has lost 7 pounds since April 2017.  Marlan Palau. Keith Alivia Cimino MD 08/07/2015 11:19 AM  [facility] [address] [place], [place] [address]  Phone [phone] Fax [phone]

## 2015-08-08 LAB — CBC WITH DIFFERENTIAL/PLATELET
BASOS ABS: 0.1 10*3/uL (ref 0.0–0.2)
Basos: 1 %
EOS (ABSOLUTE): 0.1 10*3/uL (ref 0.0–0.4)
Eos: 1 %
HEMOGLOBIN: 12.8 g/dL (ref 11.1–15.9)
Hematocrit: 39.3 % (ref 34.0–46.6)
IMMATURE GRANS (ABS): 0 10*3/uL (ref 0.0–0.1)
IMMATURE GRANULOCYTES: 0 %
LYMPHS: 32 %
Lymphocytes Absolute: 2.5 10*3/uL (ref 0.7–3.1)
MCH: 28.1 pg (ref 26.6–33.0)
MCHC: 32.6 g/dL (ref 31.5–35.7)
MCV: 86 fL (ref 79–97)
MONOCYTES: 6 %
Monocytes Absolute: 0.4 10*3/uL (ref 0.1–0.9)
Neutrophils Absolute: 4.5 10*3/uL (ref 1.4–7.0)
Neutrophils: 60 %
Platelets: 362 10*3/uL (ref 150–379)
RBC: 4.55 x10E6/uL (ref 3.77–5.28)
RDW: 14.2 % (ref 12.3–15.4)
WBC: 7.6 10*3/uL (ref 3.4–10.8)

## 2015-08-08 LAB — COMPREHENSIVE METABOLIC PANEL
ALK PHOS: 78 IU/L (ref 39–117)
ALT: 8 IU/L (ref 0–32)
AST: 12 IU/L (ref 0–40)
Albumin/Globulin Ratio: 1.5 (ref 1.2–2.2)
Albumin: 4.4 g/dL (ref 3.5–5.5)
BILIRUBIN TOTAL: 0.6 mg/dL (ref 0.0–1.2)
BUN/Creatinine Ratio: 11 (ref 9–23)
BUN: 10 mg/dL (ref 6–20)
CHLORIDE: 104 mmol/L (ref 96–106)
CO2: 18 mmol/L (ref 18–29)
Calcium: 9.4 mg/dL (ref 8.7–10.2)
Creatinine, Ser: 0.93 mg/dL (ref 0.57–1.00)
GFR calc non Af Amer: 85 mL/min/{1.73_m2} (ref >59)
GFR, EST AFRICAN AMERICAN: 98 mL/min/{1.73_m2} (ref >59)
GLUCOSE: 82 mg/dL (ref 65–99)
Globulin, Total: 2.9 g/dL (ref 1.5–4.5)
Potassium: 4.2 mmol/L (ref 3.5–5.2)
Sodium: 140 mmol/L (ref 134–144)
TOTAL PROTEIN: 7.3 g/dL (ref 6.0–8.5)

## 2015-08-08 LAB — TSH: TSH: 1.6 u[IU]/mL (ref 0.450–4.500)

## 2015-08-18 ENCOUNTER — Encounter: Payer: 59 | Admitting: Gynecology

## 2015-08-21 ENCOUNTER — Ambulatory Visit (INDEPENDENT_AMBULATORY_CARE_PROVIDER_SITE_OTHER): Payer: BLUE CROSS/BLUE SHIELD | Admitting: Gynecology

## 2015-08-21 ENCOUNTER — Encounter: Payer: Self-pay | Admitting: Gynecology

## 2015-08-21 VITALS — BP 118/78 | Ht 67.0 in | Wt 253.0 lb

## 2015-08-21 DIAGNOSIS — Z01419 Encounter for gynecological examination (general) (routine) without abnormal findings: Secondary | ICD-10-CM

## 2015-08-21 DIAGNOSIS — IMO0002 Reserved for concepts with insufficient information to code with codable children: Secondary | ICD-10-CM

## 2015-08-21 DIAGNOSIS — R896 Abnormal cytological findings in specimens from other organs, systems and tissues: Secondary | ICD-10-CM

## 2015-08-21 NOTE — Addendum Note (Signed)
Addended by: Dayna BarkerGARDNER, Kaleesi Guyton K on: 08/21/2015 11:37 AM   Modules accepted: Orders, SmartSet

## 2015-08-21 NOTE — Progress Notes (Signed)
    Michele Douglas 03-Jan-1991 [id number]        25 y.o.  G0P0  for annual exam.  Doing well without complaints  Past medical history,surgical history, problem list, medications, allergies, family history and social history were all reviewed and documented as reviewed in the EPIC chart.  ROS:  Performed with pertinent positives and negatives included in the history, assessment and plan.   Additional significant findings :  None   Exam: Michele PortelaKim Douglas assistant Filed Vitals:   08/21/15 1056  BP: 118/78  Height: 5\' 7"  (1.702 m)  Weight: 253 lb (114.76 kg)   General appearance:  Normal affect, orientation and appearance. Skin: Grossly normal HEENT: Without gross lesions.  No cervical or supraclavicular adenopathy. Thyroid normal.  Lungs:  Clear without wheezing, rales or rhonchi Cardiac: RR, without RMG Abdominal:  Soft, nontender, without masses, guarding, rebound, organomegaly or hernia Breasts:  Examined lying and sitting without masses, retractions, discharge or axillary adenopathy. Pelvic:  Ext/BUS/vagina Normal  Cervix normal. Pap smear done  Uterus anteverted, normal size, shape and contour, midline and mobile nontender   Adnexa without masses or tenderness    Anus and perineum normal    Assessment/Plan:  25 y.o. G0P0 female for annual exam with regular menses, abstinent birth control regular menses.   1. Patient was having some spotting last year when I saw her but said this all resolved and she is doing well now. 2. Contraception. Patient not sexually active nor plans to be any time soon. Will call if she wants to discuss contraception. 3. History of LGSIL 2015. Colposcopy and biopsy consistent with LGSIL. Negative ECC.  Pap smear/HPV -2016. Pap smear done today. 4. STD screening offered and declined. 5. Gardasil series reportedly received. 6. Breast health. SBE monthly reviewed. 7. Health maintenance. No routine blood work done as this was recently done this year by  another physician. Follow up 1 year, sooner as needed.     Michele Douglas,Michele Douglas P MD, 11:23 AM 08/21/2015

## 2015-08-21 NOTE — Patient Instructions (Signed)

## 2015-08-22 LAB — URINALYSIS W MICROSCOPIC + REFLEX CULTURE
BILIRUBIN URINE: NEGATIVE
Bacteria, UA: NONE SEEN [HPF]
CRYSTALS: NONE SEEN [HPF]
Casts: NONE SEEN [LPF]
Glucose, UA: NEGATIVE
Hgb urine dipstick: NEGATIVE
KETONES UR: NEGATIVE
Leukocytes, UA: NEGATIVE
NITRITE: NEGATIVE
PH: 5 (ref 5.0–8.0)
Protein, ur: NEGATIVE
RBC / HPF: NONE SEEN RBC/HPF (ref <=2)
SPECIFIC GRAVITY, URINE: 1.022 (ref 1.001–1.035)
SQUAMOUS EPITHELIAL / LPF: NONE SEEN [HPF] (ref <=5)
WBC UA: NONE SEEN WBC/HPF (ref <=5)
Yeast: NONE SEEN [HPF]

## 2015-08-25 LAB — PAP IG W/ RFLX HPV ASCU

## 2015-10-08 ENCOUNTER — Encounter: Payer: Self-pay | Admitting: Adult Health

## 2015-10-08 ENCOUNTER — Ambulatory Visit (INDEPENDENT_AMBULATORY_CARE_PROVIDER_SITE_OTHER): Payer: BLUE CROSS/BLUE SHIELD | Admitting: Adult Health

## 2015-10-08 VITALS — BP 122/78 | HR 64 | Resp 14 | Ht 67.0 in | Wt 250.4 lb

## 2015-10-08 DIAGNOSIS — G43009 Migraine without aura, not intractable, without status migrainosus: Secondary | ICD-10-CM

## 2015-10-08 DIAGNOSIS — G932 Benign intracranial hypertension: Secondary | ICD-10-CM

## 2015-10-08 MED ORDER — ONDANSETRON HCL 4 MG PO TABS: 4.0000 mg | ORAL_TABLET | Freq: Three times a day (TID) | ORAL | 0 refills | Status: DC | PRN

## 2015-10-08 MED ORDER — TOPIRAMATE 50 MG PO TABS: ORAL_TABLET | ORAL | 5 refills | Status: DC

## 2015-10-08 NOTE — Progress Notes (Signed)
PATIENT: Michele Douglas DOB: 1990/04/24  REASON FOR VISIT: follow up- obesity, migraine headaches, pseudotumor cerebri HISTORY FROM: patient  HISTORY OF PRESENT ILLNESS: Michele Douglas is a 25 year old female with a history of obesity, migraine headaches and pseudotumor cerebri. She returns today for follow-up. She reports that she is tolerating Topamax well. She denies any headaches. She states occasionally she will get pain in the left side of the neck. She states this occurs approximately once every 1-2 weeks. She states it used to radiate down into the shoulder however and no longer does that. She describes it as a pressure sensation. She states that if she applies pressure to the area it does improve. Denies light or noise sensitivity. She does occasionally have some nausea but no vomiting. She states that if she misses taking her Topamax- neck pain is worse. She did see neuro-ophthalmology at [facility] and report in care everywhere shows no papilledema. She remains on Lasix and potassium -tolerating that well. She does note that she developed a rash when she went to the beach. Her primary care felt this was related to Lasix and sunlight. She reports that she is exercising daily. She has lost 35 pounds since February. She returns today for an evaluation.  HISTORY 08/07/15 Anne Hahn): Michele Douglas is a 25 year old right-handed black female with a history of obesity, migraine headache, and pseudotumor cerebri. The patient has not been able to tolerate Diamox secondary to stomach upset and nausea even on a low dose. She was placed on Topamax which was better tolerated, but the patient is now having similar side effects on a 100 mg twice daily dose. She is having dizziness, nausea, and cognitive slowing on this dose. The patient has not felt well over the last 2 weeks. The patient has difficulty concentrating while at work. She has had increased fatigue, excessive daytime drowsiness. She has been placed on Lasix and  potassium supplementation as the headaches were returning. She has done better with the headaches, she has had only one left occipital headache over the last 2 weeks. She is concerned about the other side effects, however. She returns to this office for an evaluation. She does report some occasional neck stiffness. She will be seeing a neuro-ophthalmologist within the next week.  REVIEW OF SYSTEMS: Out of a complete 14 system review of symptoms, the patient complains only of the following symptoms, and all other reviewed systems are negative.  Nausea, dizziness, neck pain, rash, eye pain  ALLERGIES: Allergies  Allergen Reactions  . Lactose Other (See Comments)    Sick very very bad   . Diamox [Acetazolamide] Other (See Comments)    Tingling in fingers and lips  . Ibuprofen Nausea And Vomiting    HOME MEDICATIONS: Outpatient Medications Prior to Visit  Medication Sig Dispense Refill  . albuterol (PROVENTIL HFA;VENTOLIN HFA) 108 (90 BASE) MCG/ACT inhaler Inhale 1-2 puffs into the lungs every 4 (four) hours as needed. 1 Inhaler 1  . beclomethasone (QVAR) 40 MCG/ACT inhaler Inhale 1-2 puffs into the lungs 2 (two) times daily. 1 Inhaler 3  . furosemide (LASIX) 20 MG tablet Take 1 tablet (20 mg total) by mouth daily. 30 tablet 3  . omeprazole (PRILOSEC) 20 MG capsule Take 20 mg by mouth daily.    . potassium chloride (K-DUR) 10 MEQ tablet Take 1 tablet (10 mEq total) by mouth 2 (two) times daily. 60 tablet 3  . propranolol ER (INDERAL LA) 60 MG 24 hr capsule Take 60 mg by mouth  daily.  2  . triamcinolone cream (KENALOG) 0.1 % Apply 1 application topically 2 (two) times daily. 30 g 1  . ondansetron (ZOFRAN) 4 MG tablet Take 1 tablet (4 mg total) by mouth every 8 (eight) hours as needed for nausea or vomiting. 20 tablet 0  . topiramate (TOPAMAX) 100 MG tablet 1/2 tablet in the morning and 1 tablet in the evening 60 tablet 3   No facility-administered medications prior to visit.     PAST  MEDICAL HISTORY: Past Medical History:  Diagnosis Date  . Asthma   . Essential tremor   . GERD (gastroesophageal reflux disease)   . Lactose intolerance   . 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 Pseudotumor cerebri   . Tremor     PAST SURGICAL HISTORY: Past Surgical History:  Procedure Laterality Date  . BREAST SURGERY  2006   cystectomy  . TYMPANOPLASTY     left x 2    FAMILY HISTORY: Family History  Problem Relation Age of Onset  . Hypertension Mother   . Migraines Mother   . AAA (abdominal aortic aneurysm) Father   . Migraines Sister   . Myasthenia gravis Sister     SOCIAL HISTORY: Social History   Social History  . Marital status: Single    Spouse name: N/A  . Number of children: 0  . Years of education: college   Occupational History  . Not on file.   Social History Main Topics  . Smoking status: Never Smoker  . Smokeless tobacco: Never Used  . Alcohol use 0.0 oz/week     Comment: Wine or mixed drink ocassionally  . Drug use: No  . Sexual activity: Not Currently    Birth control/ protection: None     Comment: 1st intercourse 25 yo-Fewer than 5 partners   Other Topics Concern  . Not on file   Social History Narrative   Patient drinks caffeine occasionally.   Patient is right handed.       PHYSICAL EXAM  Vitals:   10/08/15 0929  BP: 122/78  Pulse: 64  Resp: 14  Weight: 250 lb 6.4 oz (113.6 kg)  Height: 5\' 7"  (1.702 m)   Body mass index is 39.22 kg/m.  Generalized: Well developed, in no acute distress   Neurological examination  Mentation: Alert oriented to time, place, history taking. Follows all commands speech and language fluent Cranial nerve II-XII: Pupils were equal round reactive to light. Extraocular movements were full, visual field were full on confrontational test. Facial sensation and strength were normal. Uvula tongue midline. Head turning and shoulder  shrug  were normal and symmetric. Motor: The motor testing reveals 5 over 5 strength of all 4 extremities. Good symmetric motor tone is noted throughout.  Sensory: Sensory testing is intact to soft touch on all 4 extremities. No evidence of extinction is noted.  Coordination: Cerebellar testing reveals good finger-nose-finger and heel-to-shin bilaterally.  Gait and station: Gait is normal. Tandem gait is normal. Romberg is negative. No drift is seen.  Reflexes: Deep tendon reflexes are symmetric and normal bilaterally.   DIAGNOSTIC DATA (LABS, IMAGING, TESTING) - I reviewed patient records, labs, notes, testing and imaging myself where available.  Lab Results  Component Value Date   WBC 7.6 08/07/2015   HGB 12.4 08/15/2014   HCT 39.3 08/07/2015   MCV 86 08/07/2015   PLT 362 08/07/2015      Component Value Date/Time  NA 140 08/07/2015 1007   K 4.2 08/07/2015 1007   CL 104 08/07/2015 1007   CO2 18 08/07/2015 1007   GLUCOSE 82 08/07/2015 1007   GLUCOSE 76 08/15/2014 0920   BUN 10 08/07/2015 1007   CREATININE 0.93 08/07/2015 1007   CREATININE 0.86 08/15/2014 0920   CALCIUM 9.4 08/07/2015 1007   PROT 7.3 08/07/2015 1007   ALBUMIN 4.4 08/07/2015 1007   AST 12 08/07/2015 1007   ALT 8 08/07/2015 1007   ALKPHOS 78 08/07/2015 1007   BILITOT 0.6 08/07/2015 1007   GFRNONAA 85 08/07/2015 1007   GFRAA 98 08/07/2015 1007       ASSESSMENT AND PLAN 25 y.o. year old female  has a past medical history of Asthma; Essential tremor; GERD (gastroesophageal reflux disease); Lactose intolerance; LGSIL (low grade squamous intraepithelial dysplasia) (07/2013); Pseudotumor cerebri; and Tremor. here with:  1. Pseudotumor cerebri 2. Migraine headache  Overall the patient is doing well. Will continue on Topamax, Lasix and potassium. Advised that if her symptoms worsen or she develops any new symptoms she she'll let us know. Will follow-up in 6 months or sooner if needed.  Butch Penny, MSN,  NP-C 10/08/2015, 10:17 AM [facility] [address] [place], [place] [address] [phone]

## 2015-10-08 NOTE — Patient Instructions (Addendum)
Continue Topamax, lasix, potassium If your symptoms worsen or you develop new symptoms please let us know.

## 2015-10-08 NOTE — Progress Notes (Signed)
I have read the note, and I agree with the clinical assessment and plan.  WILLIS,CHARLES KEITH   

## 2015-10-15 ENCOUNTER — Ambulatory Visit: Payer: Self-pay | Admitting: Neurology

## 2015-11-25 ENCOUNTER — Other Ambulatory Visit: Payer: Self-pay | Admitting: Neurology

## 2015-12-24 ENCOUNTER — Other Ambulatory Visit: Payer: Self-pay | Admitting: Neurology

## 2015-12-29 ENCOUNTER — Ambulatory Visit (INDEPENDENT_AMBULATORY_CARE_PROVIDER_SITE_OTHER): Payer: BLUE CROSS/BLUE SHIELD | Admitting: Family Medicine

## 2015-12-29 VITALS — BP 122/82 | HR 87 | Temp 99.1°F | Resp 17 | Ht 67.0 in | Wt 248.0 lb

## 2015-12-29 DIAGNOSIS — J029 Acute pharyngitis, unspecified: Secondary | ICD-10-CM | POA: Diagnosis not present

## 2015-12-29 LAB — POCT RAPID STREP A (OFFICE): Rapid Strep A Screen: NEGATIVE

## 2015-12-29 MED ORDER — AMOXICILLIN-POT CLAVULANATE 875-125 MG PO TABS: 1.0000 | ORAL_TABLET | Freq: Two times a day (BID) | ORAL | 0 refills | Status: DC

## 2015-12-29 MED ORDER — FLUCONAZOLE 150 MG PO TABS: 150.0000 mg | ORAL_TABLET | Freq: Once | ORAL | 0 refills | Status: AC

## 2015-12-29 NOTE — Progress Notes (Signed)
Patient ID: Michele Douglas, female    DOB: Jul 22, 1990, 25 y.o.   MRN: [id number]  PCP: Shade FloodGREENE,JEFFREY R, MD  Chief Complaint  Patient presents with  . Fever  . Sore Throat    Onset 3 days  . Nausea    Subjective:   HPI 25 year old female, presents for evaluation of sore throat times 2 days. [facility]. She reports a persistent fever over the last 2 days with highest temperature 100.6 F. Pain is localized to the right side of throat only. She reports awaking with chills and being drenched in a wet sweat this morning.Increased fatigue. Reports throat is burning and throbbing simply swallowing. Spent the weekend around children and begin to feel ill 2 days later. Currently taking ibuprofen and reports that she continues to feel feverish and pain.  Social History   Social History  . Marital status: Single    Spouse name: N/A  . Number of children: 0  . Years of education: college   Occupational History  . Not on file.   Social History Main Topics  . Smoking status: Never Smoker  . Smokeless tobacco: Never Used  . Alcohol use 0.0 oz/week     Comment: Wine or mixed drink ocassionally  . Drug use: No  . Sexual activity: Not Currently    Birth control/ protection: None     Comment: 1st intercourse 25 yo-Fewer than 5 partners   Other Topics Concern  . Not on file   Social History Narrative   Patient drinks caffeine occasionally.   Patient is right handed.    Family History  Problem Relation Age of Onset  . Hypertension Mother   . Migraines Mother   . AAA (abdominal aortic aneurysm) Father   . Migraines Sister   . Myasthenia gravis Sister    Review of Systems See HPI  Patient Active Problem List   Diagnosis Date Noted  . Pseudotumor cerebri 04/21/2015  . Common migraine 02/20/2015  . Benign essential tremor 02/16/2015  . Intermittent vertigo 06/03/2014  . 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 Static tremor  04/17/2012     Prior to Admission medications   Medication Sig Start Date End Date Taking? Authorizing Provider  albuterol (PROVENTIL HFA;VENTOLIN HFA) 108 (90 BASE) MCG/ACT inhaler Inhale 1-2 puffs into the lungs every 4 (four) hours as needed. 02/18/15  Yes Shade FloodJeffrey R Greene, MD  beclomethasone (QVAR) 40 MCG/ACT inhaler Inhale 1-2 puffs into the lungs 2 (two) times daily. 02/16/15  Yes Shade FloodJeffrey R Greene, MD  furosemide (LASIX) 20 MG tablet TAKE 1 TABLET(20 MG) BY MOUTH DAILY 12/25/15  Yes York Spanielharles K Willis, MD  omeprazole (PRILOSEC) 20 MG capsule Take 20 mg by mouth daily.   Yes Historical Provider, MD  ondansetron (ZOFRAN) 4 MG tablet Take 1 tablet (4 mg total) by mouth every 8 (eight) hours as needed for nausea or vomiting. 10/08/15  Yes Butch PennyMegan Millikan, NP  potassium chloride (K-DUR) 10 MEQ tablet TAKE 1 TABLET(10 MEQ) BY MOUTH TWICE DAILY 12/25/15  Yes York Spanielharles K Willis, MD  propranolol ER (INDERAL LA) 60 MG 24 hr capsule Take 60 mg by mouth daily. 01/27/15  Yes Historical Provider, MD  topiramate (TOPAMAX) 50 MG tablet 1 tablet in the morning and 2 tablet in the evening 10/08/15  Yes Butch PennyMegan Millikan, NP  triamcinolone cream (KENALOG) 0.1 % Apply 1 application topically 2 (two) times daily. 08/23/14  Yes Shade FloodJeffrey R Greene, MD    Allergies  Allergen Reactions  . Lactose Other (See Comments)    Sick very very bad   . Diamox [Acetazolamide] Other (See Comments)    Tingling in fingers and lips  . Ibuprofen Nausea And Vomiting     Objective:  Physical Exam  Constitutional: She appears well-developed and well-nourished.  HENT:  Head: Normocephalic and atraumatic.  Right Ear: Hearing, tympanic membrane, external ear and ear canal normal.  Left Ear: Hearing, tympanic membrane, external ear and ear canal normal.  Nose: Nose normal.  Mouth/Throat: Uvula is midline. Posterior oropharyngeal edema and posterior oropharyngeal erythema present. No tonsillar abscesses.  Pre-auricular gland enlargement  right compared to left.  Eyes: Conjunctivae are normal. Pupils are equal, round, and reactive to light.  Neck: Normal range of motion. Neck supple.  Cardiovascular: Normal rate, regular rhythm, normal heart sounds and intact distal pulses.   Pulmonary/Chest: Effort normal and breath sounds normal.  Musculoskeletal: Normal range of motion.  Lymphadenopathy:    She has cervical adenopathy.  Neurological: She is alert.  Skin: Skin is warm and dry.  Psychiatric: She has a normal mood and affect. Her behavior is normal. Judgment and thought content normal.      Vitals:   12/29/15 1504  BP: 122/82  Pulse: 87  Resp: 17  Temp: 99.1 F (37.3 C)   Assessment & Plan:  1. Sore throat - POCT rapid strep A - Culture, Group A Strep  Plan: . amoxicillin-clavulanate (AUGMENTIN) 875-125 MG tablet    Sig: Take 1 tablet by mouth 2 (two) times daily.    Dispense:  20 tablet  . fluconazole (DIFLUCAN) 150 MG tablet    Sig: Take 1 tablet (150 mg total) by mouth once. Repeat if needed    Dispense:  2 tablet   Return for follow-up within 48 hours if throat pain increases.  Continue ibuprofen as needed for pain.  Godfrey PickKimberly S. Tiburcio PeaHarris, MSN, FNP-C [facility]

## 2015-12-29 NOTE — Patient Instructions (Addendum)
Ibuprofen and Tylenol continue for fever and pain.  Amoxicillin-Clauvate  1 tablet twice daily.  If vaginal irritation begins, take 1 diflucan as needed.  Godfrey PickKimberly S. Tiburcio PeaHarris, MSN, FNP-C [facility]   IF you received an x-ray today, you will receive an invoice from [facility]. Please contact [facility] at [phone] with questions or concerns regarding your invoice.   IF you received labwork today, you will receive an invoice from [facility]. Please contact [facility] at [phone] with questions or concerns regarding your invoice.   Our billing staff will not be able to assist you with questions regarding bills from these companies.  You will be contacted with the lab results as soon as they are available. The fastest way to get your results is to activate your My Chart account. Instructions are located on the last page of this paperwork. If you have not heard from us regarding the results in 2 weeks, please contact this office.    Pharyngitis Pharyngitis is redness, pain, and swelling (inflammation) of your pharynx.  CAUSES  Pharyngitis is usually caused by infection. Most of the time, these infections are from viruses (viral) and are part of a cold. However, sometimes pharyngitis is caused by bacteria (bacterial). Pharyngitis can also be caused by allergies. Viral pharyngitis may be spread from person to person by coughing, sneezing, and personal items or utensils (cups, forks, spoons, toothbrushes). Bacterial pharyngitis may be spread from person to person by more intimate contact, such as kissing.  SIGNS AND SYMPTOMS  Symptoms of pharyngitis include:   Sore throat.   Tiredness (fatigue).   Low-grade fever.   Headache.  Joint pain and muscle aches.  Skin rashes.  Swollen lymph nodes.  Plaque-like film on throat or tonsils (often seen with bacterial pharyngitis). DIAGNOSIS   Your health care provider will ask you questions about your illness and your symptoms. Your medical history, along with a physical exam, is often all that is needed to diagnose pharyngitis. Sometimes, a rapid strep test is done. Other lab tests may also be done, depending on the suspected cause.  TREATMENT  Viral pharyngitis will usually get better in 3-4 days without the use of medicine. Bacterial pharyngitis is treated with medicines that kill germs (antibiotics).  HOME CARE INSTRUCTIONS   Drink enough water and fluids to keep your urine clear or pale yellow.   Only take over-the-counter or prescription medicines as directed by your health care provider:   If you are prescribed antibiotics, make sure you finish them even if you start to feel better.   Do not take aspirin.   Get lots of rest.   Gargle with 8 oz of salt water ( tsp of salt per 1 qt of water) as often as every 1-2 hours to soothe your throat.   Throat lozenges (if you are not at risk for choking) or sprays may be used to soothe your throat. SEEK MEDICAL CARE IF:   You have large, tender lumps in your neck.  You have a rash.  You cough up green, yellow-brown, or bloody spit. SEEK IMMEDIATE MEDICAL CARE IF:   Your neck becomes stiff.  You drool or are unable to swallow liquids.  You vomit or are unable to keep medicines or liquids down.  You have severe pain that does not go away with the use of recommended medicines.  You have trouble breathing (not caused by a stuffy nose). MAKE SURE YOU:   Understand  these instructions.  Will watch your condition.  Will get help right away if you are not doing well or get worse.   This information is not intended to replace advice given to you by your health care provider. Make sure you discuss any questions you have with your health care provider.   Document Released: 02/15/2005 Document Revised: 12/06/2012 Document Reviewed: 10/23/2012 Elsevier Interactive  Patient Education [employer]2016 [employer] Inc.

## 2015-12-31 LAB — CULTURE, GROUP A STREP: Organism ID, Bacteria: NORMAL

## 2016-02-09 ENCOUNTER — Ambulatory Visit (INDEPENDENT_AMBULATORY_CARE_PROVIDER_SITE_OTHER): Payer: BLUE CROSS/BLUE SHIELD | Admitting: Physician Assistant

## 2016-02-09 ENCOUNTER — Ambulatory Visit (INDEPENDENT_AMBULATORY_CARE_PROVIDER_SITE_OTHER): Payer: BLUE CROSS/BLUE SHIELD

## 2016-02-09 ENCOUNTER — Other Ambulatory Visit: Payer: Self-pay | Admitting: Family Medicine

## 2016-02-09 VITALS — BP 110/80 | HR 85 | Temp 99.1°F | Resp 17 | Ht 67.0 in | Wt 251.0 lb

## 2016-02-09 DIAGNOSIS — J4521 Mild intermittent asthma with (acute) exacerbation: Secondary | ICD-10-CM

## 2016-02-09 DIAGNOSIS — J302 Other seasonal allergic rhinitis: Secondary | ICD-10-CM

## 2016-02-09 DIAGNOSIS — R059 Cough, unspecified: Secondary | ICD-10-CM

## 2016-02-09 DIAGNOSIS — R9389 Abnormal findings on diagnostic imaging of other specified body structures: Secondary | ICD-10-CM

## 2016-02-09 DIAGNOSIS — J4520 Mild intermittent asthma, uncomplicated: Secondary | ICD-10-CM

## 2016-02-09 DIAGNOSIS — R0989 Other specified symptoms and signs involving the circulatory and respiratory systems: Secondary | ICD-10-CM | POA: Diagnosis not present

## 2016-02-09 DIAGNOSIS — R05 Cough: Secondary | ICD-10-CM | POA: Diagnosis not present

## 2016-02-09 DIAGNOSIS — R938 Abnormal findings on diagnostic imaging of other specified body structures: Secondary | ICD-10-CM | POA: Diagnosis not present

## 2016-02-09 LAB — POCT URINALYSIS DIP (MANUAL ENTRY)
BILIRUBIN UA: NEGATIVE
BILIRUBIN UA: NEGATIVE
Blood, UA: NEGATIVE
Glucose, UA: NEGATIVE
LEUKOCYTES UA: NEGATIVE
NITRITE UA: NEGATIVE
PH UA: 6
PROTEIN UA: NEGATIVE
Spec Grav, UA: 1.02
Urobilinogen, UA: 0.2

## 2016-02-09 MED ORDER — IPRATROPIUM BROMIDE 0.02 % IN SOLN
0.5000 mg | Freq: Once | RESPIRATORY_TRACT | Status: AC
  Administered 2016-02-09: 0.5 mg via RESPIRATORY_TRACT

## 2016-02-09 MED ORDER — BECLOMETHASONE DIPROPIONATE 40 MCG/ACT IN AERS: 1.0000 | INHALATION_SPRAY | Freq: Two times a day (BID) | RESPIRATORY_TRACT | 3 refills | Status: DC

## 2016-02-09 MED ORDER — PEAK FLOW METER DEVI: 0 refills | Status: DC

## 2016-02-09 MED ORDER — LEVOCETIRIZINE DIHYDROCHLORIDE 5 MG PO TABS: 5.0000 mg | ORAL_TABLET | Freq: Every evening | ORAL | 11 refills | Status: DC

## 2016-02-09 MED ORDER — ALBUTEROL SULFATE (2.5 MG/3ML) 0.083% IN NEBU
2.5000 mg | INHALATION_SOLUTION | Freq: Once | RESPIRATORY_TRACT | Status: AC
  Administered 2016-02-09: 2.5 mg via RESPIRATORY_TRACT

## 2016-02-09 MED ORDER — ALBUTEROL SULFATE HFA 108 (90 BASE) MCG/ACT IN AERS: 1.0000 | INHALATION_SPRAY | RESPIRATORY_TRACT | 1 refills | Status: DC | PRN

## 2016-02-09 MED ORDER — FLUTICASONE PROPIONATE 50 MCG/ACT NA SUSP: 2.0000 | Freq: Every day | NASAL | 6 refills | Status: DC

## 2016-02-09 NOTE — Patient Instructions (Signed)
     IF you received an x-ray today, you will receive an invoice from [facility]. Please contact [facility] at [phone] with questions or concerns regarding your invoice.   IF you received labwork today, you will receive an invoice from [facility]. Please contact [facility] at [phone] with questions or concerns regarding your invoice.   Our billing staff will not be able to assist you with questions regarding bills from these companies.  You will be contacted with the lab results as soon as they are available. The fastest way to get your results is to activate your My Chart account. Instructions are located on the last page of this paperwork. If you have not heard from us regarding the results in 2 weeks, please contact this office.      

## 2016-02-09 NOTE — Progress Notes (Signed)
02/09/2016 3:24 PM   DOB: [birth year] / MRN: [id number]  SUBJECTIVE:  Michele Douglas is a 25 y.o. female presenting for 1 week of cough that has been productive of mucous.  Reports this is very thick. Complains of chest tightness and some posterior right thoracic pain.  She has been asthmatic her entire life.  She has tried albuterol nebs and this made most of her symptoms go away. She denies SOB, nasal congestion.   She has just fininshed a course of augmentin for strep about 1 month ago.    She is allergic to lactose and diamox [acetazolamide].   She  has a past medical history of Asthma; Essential tremor; GERD (gastroesophageal reflux disease); Lactose intolerance; LGSIL (low grade squamous intraepithelial dysplasia) (07/2013); Pseudotumor cerebri; and Tremor.    She  reports that she has never smoked. She has never used smokeless tobacco. She reports that she drinks alcohol. She reports that she does not use drugs. She  reports that she does not currently engage in sexual activity. She reports using the following method of birth control/protection: None. The patient  has a past surgical history that includes Tympanoplasty and Breast surgery (2006).  Her family history includes AAA (abdominal aortic aneurysm) in her father; Hypertension in her mother; Migraines in her mother and sister; Myasthenia gravis in her sister.  Review of Systems  Constitutional: Negative for chills and fever.  HENT: Negative for sore throat.   Respiratory: Positive for cough and sputum production. Negative for hemoptysis, shortness of breath and wheezing.   Cardiovascular: Negative for chest pain, orthopnea and leg swelling.  Gastrointestinal: Negative for nausea.  Musculoskeletal: Negative for myalgias.  Neurological: Negative for dizziness.    The problem list and medications were reviewed and updated by myself where necessary and exist elsewhere in the encounter.   OBJECTIVE:  BP 110/80 (BP Location:  Right Arm, Patient Position: Sitting, Cuff Size: Large)   Pulse 85   Temp 99.1 F (37.3 C) (Oral)   Resp 17   Ht 5\' 7"  (1.702 m)   Wt 251 lb (113.9 kg)   LMP 01/26/2016   SpO2 99%   BMI 39.31 kg/m   Physical Exam  Constitutional: She is oriented to person, place, and time. She appears well-nourished. No distress.  Eyes: EOM are normal. Pupils are equal, round, and reactive to light.  Cardiovascular: Normal rate.   Pulmonary/Chest: Effort normal and breath sounds normal. She has no wheezes. She has no rales. She exhibits no tenderness.  Peak flow 325 to 450 with one duoneb.   Abdominal: Soft. She exhibits no distension. There is no tenderness.  Musculoskeletal: Normal range of motion.  Neurological: She is alert and oriented to person, place, and time. No cranial nerve deficit. Gait normal.  Skin: Skin is dry. She is not diaphoretic.  Psychiatric: She has a normal mood and affect.  Vitals reviewed.     Results for orders placed or performed in visit on 02/09/16 (from the past 72 hour(s))  POCT urinalysis dipstick     Status: None   Collection Time: 02/09/16  3:08 PM  Result Value Ref Range   Color, UA yellow yellow   Clarity, UA clear clear   Glucose, UA negative negative   Bilirubin, UA negative negative   Ketones, POC UA negative negative   Spec Grav, UA 1.020    Blood, UA negative negative   pH, UA 6.0    Protein Ur, POC negative negative   Urobilinogen, UA 0.2  Nitrite, UA Negative Negative   Leukocytes, UA Negative Negative    Dg Chest 2 View  Result Date: 02/09/2016 CLINICAL DATA:  Right lower posterior back pain with cough for about 10 days. EXAM: CHEST  2 VIEW COMPARISON:  Chest x-ray of November 20, 2013 FINDINGS: The lungs are adequately inflated and clear. The heart and pulmonary vascularity are normal. The mediastinum is normal in width. The trachea is midline. The bony thorax is unremarkable. IMPRESSION: There is no pneumonia nor other acute  cardiopulmonary abnormality. Incidental note is made of a calcification in the upper abdomen on the lateral view only which may reflect a kidney stone or gallstone. Evaluation of the gallbladder and kidneys with ultrasound would be useful. Electronically Signed   By: David  SwazilandJordan M.D.   On: 02/09/2016 14:46    ASSESSMENT AND PLAN  Itzia was seen today for medication refill.  Diagnoses and all orders for this visit:  Cough -     DG Chest 2 View; Future  Chronic seasonal allergic rhinitis, unspecified trigger -     levocetirizine (XYZAL) 5 MG tablet; Take 1 tablet (5 mg total) by mouth every evening. -     fluticasone (FLONASE) 50 MCG/ACT nasal spray; Place 2 sprays into both nostrils daily.  Abnormally low peak expiratory flow rate -     albuterol (PROVENTIL HFA;VENTOLIN HFA) 108 (90 Base) MCG/ACT inhaler; Inhale 1-2 puffs into the lungs every 4 (four) hours as needed. -     beclomethasone (QVAR) 40 MCG/ACT inhaler; Inhale 1-2 puffs into the lungs 2 (two) times daily. -     albuterol (PROVENTIL) (2.5 MG/3ML) 0.083% nebulizer solution 2.5 mg; Take 3 mLs (2.5 mg total) by nebulization once. -     ipratropium (ATROVENT) nebulizer solution 0.5 mg; Take 2.5 mLs (0.5 mg total) by nebulization once.  Abnormal chest x-ray: Urine clear for red cells which points away but does not rule out a stone.  She is also not having any nausea with po intake.  Options discussed and she would like to wait before pursuing further work up given she is asymptomatic.   -     POCT urinalysis dipstick  Mild intermittent asthma with exacerbation -     albuterol (PROVENTIL HFA;VENTOLIN HFA) 108 (90 Base) MCG/ACT inhaler; Inhale 1-2 puffs into the lungs every 4 (four) hours as needed. -     beclomethasone (QVAR) 40 MCG/ACT inhaler; Inhale 1-2 puffs into the lungs 2 (two) times daily. -     Peak Flow Meter DEVI; Once well establish a baseline.    The patient is advised to call or return to clinic if she does not  see an improvement in symptoms, or to seek the care of the closest emergency department if she worsens with the above plan.   Deliah BostonMichael Aurilla Coulibaly, MHS, PA-C [facility] 02/09/2016 3:24 PM

## 2016-02-10 ENCOUNTER — Telehealth: Payer: Self-pay | Admitting: Family Medicine

## 2016-02-10 ENCOUNTER — Telehealth: Payer: Self-pay

## 2016-02-10 ENCOUNTER — Encounter: Payer: Self-pay | Admitting: Physician Assistant

## 2016-02-10 ENCOUNTER — Other Ambulatory Visit: Payer: Self-pay | Admitting: Family Medicine

## 2016-02-10 MED ORDER — HYDROCOD POLST-CPM POLST ER 10-8 MG/5ML PO SUER: 5.0000 mL | Freq: Two times a day (BID) | ORAL | 0 refills | Status: DC | PRN

## 2016-02-10 MED ORDER — BENZONATATE 200 MG PO CAPS: 200.0000 mg | ORAL_CAPSULE | Freq: Three times a day (TID) | ORAL | 0 refills | Status: DC | PRN

## 2016-02-10 MED ORDER — GUAIFENESIN-CODEINE 300-10 MG/5ML PO LIQD: 5.0000 mL | Freq: Four times a day (QID) | ORAL | 0 refills | Status: DC

## 2016-02-10 NOTE — Telephone Encounter (Signed)
[facility] calling stating that they have questions about the dosage of the medication and what they have in stock and can they change it \  [facility] [phone]

## 2016-02-10 NOTE — Progress Notes (Signed)
Antitussive medication only for cough.

## 2016-02-10 NOTE — Telephone Encounter (Signed)
Still coughing, hoarse, not sleeping. Kim sent in guaifenesin with codeine cough syrup.  Try tessalon pearles during the day.

## 2016-02-10 NOTE — Progress Notes (Unsigned)
   Patient ID: Michele Douglas, female    DOB: 1990/07/10, 25 y.o.   MRN: [id number]  PCP: Shade FloodGREENE,JEFFREY R, MD  No chief complaint on file.   Subjective:   HPI Presents for evaluation of ***.  ***.    Review of Systems     Patient Active Problem List   Diagnosis Date Noted  . Pseudotumor cerebri 04/21/2015  . Common migraine 02/20/2015  . Benign essential tremor 02/16/2015  . Intermittent vertigo 06/03/2014  . 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 Static tremor 04/17/2012     Prior to Admission medications   Medication Sig Start Date End Date Taking? Authorizing Provider  albuterol (PROVENTIL HFA;VENTOLIN HFA) 108 (90 Base) MCG/ACT inhaler Inhale 1-2 puffs into the lungs every 4 (four) hours as needed. 02/09/16   Ofilia NeasMichael L Clark, PA-C  beclomethasone (QVAR) 40 MCG/ACT inhaler Inhale 1-2 puffs into the lungs 2 (two) times daily. 02/09/16   Ofilia NeasMichael L Clark, PA-C  benzonatate (TESSALON) 200 MG capsule Take 1 capsule (200 mg total) by mouth 3 (three) times daily as needed for cough. 02/10/16   Sherren MochaEva N Shaw, MD  fluticasone (FLONASE) 50 MCG/ACT nasal spray Place 2 sprays into both nostrils daily. 02/09/16   Ofilia NeasMichael L Clark, PA-C  furosemide (LASIX) 20 MG tablet TAKE 1 TABLET(20 MG) BY MOUTH DAILY 12/25/15   York Spanielharles K Willis, MD  Guaifenesin-Codeine 300-10 MG/5ML LIQD Take 5 mLs by mouth every 6 (six) hours. 02/10/16   Doyle AskewKimberly Stephenia Jocelin Schuelke, FNP  levocetirizine (XYZAL) 5 MG tablet Take 1 tablet (5 mg total) by mouth every evening. 02/09/16   Ofilia NeasMichael L Clark, PA-C  omeprazole (PRILOSEC) 20 MG capsule Take 20 mg by mouth daily.    Historical Provider, MD  ondansetron (ZOFRAN) 4 MG tablet Take 1 tablet (4 mg total) by mouth every 8 (eight) hours as needed for nausea or vomiting. 10/08/15   Butch PennyMegan Millikan, NP  Peak Flow Meter DEVI Once well establish a baseline. 02/09/16   Ofilia NeasMichael L Clark, PA-C  potassium chloride (K-DUR) 10 MEQ tablet TAKE 1 TABLET(10  MEQ) BY MOUTH TWICE DAILY 12/25/15   York Spanielharles K Willis, MD  propranolol ER (INDERAL LA) 60 MG 24 hr capsule Take 60 mg by mouth daily. 01/27/15   Historical Provider, MD  topiramate (TOPAMAX) 50 MG tablet 1 tablet in the morning and 2 tablet in the evening 10/08/15   Butch PennyMegan Millikan, NP  triamcinolone cream (KENALOG) 0.1 % Apply 1 application topically 2 (two) times daily. 08/23/14   Shade FloodJeffrey R Greene, MD     Allergies  Allergen Reactions  . Lactose Other (See Comments)    Sick very very bad   . Diamox [Acetazolamide] Other (See Comments)    Tingling in fingers and lips       Objective:  Physical Exam         Assessment & Plan:   ***

## 2016-03-18 ENCOUNTER — Ambulatory Visit: Payer: BLUE CROSS/BLUE SHIELD | Admitting: Adult Health

## 2016-03-22 ENCOUNTER — Encounter: Payer: Self-pay | Admitting: Adult Health

## 2016-03-22 ENCOUNTER — Ambulatory Visit (INDEPENDENT_AMBULATORY_CARE_PROVIDER_SITE_OTHER): Payer: BLUE CROSS/BLUE SHIELD | Admitting: Adult Health

## 2016-03-22 VITALS — BP 104/70 | HR 77 | Ht 67.0 in | Wt 254.2 lb

## 2016-03-22 DIAGNOSIS — G932 Benign intracranial hypertension: Secondary | ICD-10-CM

## 2016-03-22 DIAGNOSIS — H04202 Unspecified epiphora, left lacrimal gland: Secondary | ICD-10-CM

## 2016-03-22 DIAGNOSIS — R0981 Nasal congestion: Secondary | ICD-10-CM

## 2016-03-22 DIAGNOSIS — G4452 New daily persistent headache (NDPH): Secondary | ICD-10-CM

## 2016-03-22 NOTE — Progress Notes (Signed)
I have read the note, and I agree with the clinical assessment and plan.  Kalia Vahey KEITH   

## 2016-03-22 NOTE — Patient Instructions (Signed)
Continue Topamax CT of sinuses If your symptoms worsen or you develop new symptoms please let us know.

## 2016-03-22 NOTE — Progress Notes (Signed)
PATIENT: Michele Douglas DOB: [birth year]  REASON FOR VISIT: follow up- pseudotumor cerebri, migraine headaches HISTORY FROM: patient  HISTORY OF PRESENT ILLNESS: [name] is a 26 year old female with a history of pseudotumor cerebri and migraine headaches. She returns today for follow-up. She reports that she has continued taking Topamax 50 mg in morning and 100 mg in the evening. She is unable to tolerate higher dosages of Topamax. She states that since August she has had a dull headache surrounding the left eye. She states she does have nasal congestion and lacrimation of the left eye with these headaches. She does not describe the pain as severe. She states occasionally if she is able to press on the nasal bridge and above the left eye it does offer her some relief. She reports that these headaches have been ongoing since August. She states that the dull pain is usually constant throughout the day. She returns today for an evaluation.  HISTORY 10/08/15: [name] is a 26 year old female with a history of obesity, migraine headaches and pseudotumor cerebri. She returns today for follow-up. She reports that she is tolerating Topamax well. She denies any headaches. She states occasionally she will get pain in the left side of the neck. She states this occurs approximately once every 1-2 weeks. She states it used to radiate down into the shoulder however and no longer does that. She describes it as a pressure sensation. She states that if she applies pressure to the area it does improve. Denies light or noise sensitivity. She does occasionally have some nausea but no vomiting. She states that if she misses taking her Topamax- neck pain is worse. She did see neuro-ophthalmology at [facility] and report in care everywhere shows no papilledema. She remains on Lasix and potassium -tolerating that well. She does note that she developed a rash when she went to the beach. Her primary care felt this was related to  Lasix and sunlight. She reports that she is exercising daily. She has lost 35 pounds since February. She returns today for an evaluation.  HISTORY 08/07/15 Michele Douglas): [name] is a 26 year old right-handed black female with a history of obesity, migraine headache, and pseudotumor cerebri. The patient has not been able to tolerate Diamox secondary to stomach upset and nausea even on a low dose. She was placed on Topamax which was better tolerated, but the patient is now having similar side effects on a 100 mg twice daily dose. She is having dizziness, nausea, and cognitive slowing on this dose. The patient has not felt well over the last 2 weeks. The patient has difficulty concentrating while at work. She has had increased fatigue, excessive daytime drowsiness. She has been placed on Lasix and potassium supplementation as the headaches were returning. She has done better with the headaches, she has had only one left occipital headache over the last 2 weeks. She is concerned about the other side effects, however. She returns to this office for an evaluation. She does report some occasional neck stiffness. She will be seeing a neuro-ophthalmologist within the next week.   REVIEW OF SYSTEMS: Out of a complete 14 system review of symptoms, the patient complains only of the following symptoms, and all other reviewed systems are negative.  See history of present illness  ALLERGIES: Allergies  Allergen Reactions  . Lactose Other (See Comments)    Sick very very bad   . Diamox [Acetazolamide] Other (See Comments)    Tingling in fingers and lips  HOME MEDICATIONS: Outpatient Medications Prior to Visit  Medication Sig Dispense Refill  . albuterol (PROVENTIL HFA;VENTOLIN HFA) 108 (90 Base) MCG/ACT inhaler Inhale 1-2 puffs into the lungs every 4 (four) hours as needed. 1 Inhaler 1  . beclomethasone (QVAR) 40 MCG/ACT inhaler Inhale 1-2 puffs into the lungs 2 (two) times daily. 1 Inhaler 3  . fluticasone  (FLONASE) 50 MCG/ACT nasal spray Place 2 sprays into both nostrils daily. 16 g 6  . furosemide (LASIX) 20 MG tablet TAKE 1 TABLET(20 MG) BY MOUTH DAILY 30 tablet 5  . levocetirizine (XYZAL) 5 MG tablet Take 1 tablet (5 mg total) by mouth every evening. 30 tablet 11  . omeprazole (PRILOSEC) 20 MG capsule Take 20 mg by mouth daily.    . ondansetron (ZOFRAN) 4 MG tablet Take 1 tablet (4 mg total) by mouth every 8 (eight) hours as needed for nausea or vomiting. 20 tablet 0  . Peak Flow Meter DEVI Once well establish a baseline. 1 each 0  . potassium chloride (K-DUR) 10 MEQ tablet TAKE 1 TABLET(10 MEQ) BY MOUTH TWICE DAILY 60 tablet 5  . propranolol ER (INDERAL LA) 60 MG 24 hr capsule Take 60 mg by mouth daily.  2  . topiramate (TOPAMAX) 50 MG tablet 1 tablet in the morning and 2 tablet in the evening 90 tablet 5  . triamcinolone cream (KENALOG) 0.1 % Apply 1 application topically 2 (two) times daily. 30 g 1  . benzonatate (TESSALON) 200 MG capsule Take 1 capsule (200 mg total) by mouth 3 (three) times daily as needed for cough. (Patient not taking: Reported on 03/22/2016) 60 capsule 0  . chlorpheniramine-HYDROcodone (TUSSIONEX PENNKINETIC ER) 10-8 MG/5ML SUER Take 5 mLs by mouth every 12 (twelve) hours as needed for cough. 100 mL 0   No facility-administered medications prior to visit.     PAST MEDICAL HISTORY: Past Medical History:  Diagnosis Date  . Asthma   . Essential tremor   . GERD (gastroesophageal reflux disease)   . Lactose intolerance   . 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 Pseudotumor cerebri   . Tremor     PAST SURGICAL HISTORY: Past Surgical History:  Procedure Laterality Date  . BREAST SURGERY  2006   cystectomy  . TYMPANOPLASTY     left x 2    FAMILY HISTORY: Family History  Problem Relation Age of Onset  . Hypertension Mother   . Migraines Mother   . AAA (abdominal aortic aneurysm) Father   .  Migraines Sister   . Myasthenia gravis Sister     SOCIAL HISTORY: Social History   Social History  . Marital status: Single    Spouse name: N/A  . Number of children: 0  . Years of education: college   Occupational History  . Not on file.   Social History Main Topics  . Smoking status: Never Smoker  . Smokeless tobacco: Never Used  . Alcohol use 0.0 oz/week     Comment: Wine or mixed drink ocassionally  . Drug use: No  . Sexual activity: Not Currently    Birth control/ protection: None     Comment: 1st intercourse 26 yo-Fewer than 5 partners   Other Topics Concern  . Not on file   Social History Narrative   Patient drinks caffeine occasionally.   Patient is right handed.       PHYSICAL EXAM  Vitals:   03/22/16 1329  BP: 104/70  Pulse: 77  Weight: 254 lb 3.2 oz (115.3 kg)  Height: 5\' 7"  (1.702 m)   Body mass index is 39.81 kg/m.  Generalized: Well developed, in no acute distress   Neurological examination  Mentation: Alert oriented to time, place, history taking. Follows all commands speech and language fluent Cranial nerve II-XII: Pupils were equal round reactive to light. Extraocular movements were full, visual field were full on confrontational test. Facial sensation and strength were normal. Uvula tongue midline. Head turning and shoulder shrug  were normal and symmetric. Motor: The motor testing reveals 5 over 5 strength of all 4 extremities. Good symmetric motor tone is noted throughout.  Sensory: Sensory testing is intact to soft touch on all 4 extremities. No evidence of extinction is noted.  Coordination: Cerebellar testing reveals good finger-nose-finger and heel-to-shin bilaterally.  Gait and station: Gait is normal. Tandem gait is normal. Romberg is negative. No drift is seen.  Reflexes: Deep tendon reflexes are symmetric and normal bilaterally.   DIAGNOSTIC DATA (LABS, IMAGING, TESTING) - I reviewed patient records, labs, notes, testing and  imaging myself where available.  Lab Results  Component Value Date   WBC 7.6 08/07/2015   HGB 12.4 08/15/2014   HCT 39.3 08/07/2015   MCV 86 08/07/2015   PLT 362 08/07/2015      Component Value Date/Time   NA 140 08/07/2015 1007   K 4.2 08/07/2015 1007   CL 104 08/07/2015 1007   CO2 18 08/07/2015 1007   GLUCOSE 82 08/07/2015 1007   GLUCOSE 76 08/15/2014 0920   BUN 10 08/07/2015 1007   CREATININE 0.93 08/07/2015 1007   CREATININE 0.86 08/15/2014 0920   CALCIUM 9.4 08/07/2015 1007   PROT 7.3 08/07/2015 1007   ALBUMIN 4.4 08/07/2015 1007   AST 12 08/07/2015 1007   ALT 8 08/07/2015 1007   ALKPHOS 78 08/07/2015 1007   BILITOT 0.6 08/07/2015 1007   GFRNONAA 85 08/07/2015 1007   GFRAA 98 08/07/2015 1007       ASSESSMENT AND PLAN 26 y.o. year old female  has a past medical history of Asthma; Essential tremor; GERD (gastroesophageal reflux disease); Lactose intolerance; LGSIL (low grade squamous intraepithelial dysplasia) (07/2013); Pseudotumor cerebri; and Tremor. here with:  1. Pseudotumor cerebri 2. History of migraine headaches 3. New daily headache  4. Nasal congestion 5. Lacrimation the left eye  I discussed this patient's symptoms with Dr. Anne HahnWillis. We will do a CT of the sinuses to rule out potential sinus issues that could be contributing to her headaches, nasal congestion and lacrimation. She will continue on Topamax at this time. If her CT is normal we will consider another preventative medication. F/u in 6 months.   Butch PennyMegan Rohit Deloria, MSN, NP-C 03/22/2016, 2:09 PM [facility] [address], [address] [place], [place] [address] [phone]

## 2016-03-23 ENCOUNTER — Other Ambulatory Visit: Payer: Self-pay | Admitting: Adult Health

## 2016-03-24 ENCOUNTER — Telehealth: Payer: Self-pay | Admitting: Adult Health

## 2016-03-24 NOTE — Telephone Encounter (Signed)
[employer] did not approve the CT. They want to know if and how long has she had a sinus infection and if it has been treated with an antibiotics. The phone number for the peer to peer is [phone], the member ID is [id number] & DOB is 10-21-1990. Please do in 2 business days. Thank you for your help!

## 2016-03-25 NOTE — Telephone Encounter (Signed)
I completed P2P. Approval number is [id number] exp 03/24/16-04/22/16

## 2016-03-25 NOTE — Telephone Encounter (Signed)
Noted, faxed order to [facility] patient is aware.

## 2016-04-12 ENCOUNTER — Ambulatory Visit: Payer: BLUE CROSS/BLUE SHIELD | Admitting: Adult Health

## 2016-04-26 ENCOUNTER — Telehealth: Payer: Self-pay | Admitting: *Deleted

## 2016-04-26 NOTE — Telephone Encounter (Signed)
Received fax for CT Sinuses.  NORMAL.

## 2016-04-27 ENCOUNTER — Other Ambulatory Visit: Payer: Self-pay | Admitting: Physician Assistant

## 2016-04-27 MED ORDER — BECLOMETHASONE DIPROPIONATE 40 MCG/ACT IN AERS: 4.0000 | INHALATION_SPRAY | Freq: Two times a day (BID) | RESPIRATORY_TRACT | 12 refills | Status: DC

## 2016-04-27 NOTE — Telephone Encounter (Signed)
Please let the patient know her scan was normal

## 2016-04-27 NOTE — Telephone Encounter (Signed)
LMVM for pt on her cell # (ok per DPR) that her CT sinuses was normal.  She is to call back if questions.

## 2016-06-15 ENCOUNTER — Ambulatory Visit (INDEPENDENT_AMBULATORY_CARE_PROVIDER_SITE_OTHER): Payer: BLUE CROSS/BLUE SHIELD | Admitting: Family Medicine

## 2016-06-15 ENCOUNTER — Ambulatory Visit (INDEPENDENT_AMBULATORY_CARE_PROVIDER_SITE_OTHER): Payer: BLUE CROSS/BLUE SHIELD

## 2016-06-15 VITALS — BP 132/81 | HR 75 | Temp 98.9°F | Resp 18 | Ht 66.0 in

## 2016-06-15 DIAGNOSIS — M79644 Pain in right finger(s): Secondary | ICD-10-CM

## 2016-06-15 MED ORDER — IBUPROFEN 200 MG PO TABS
400.0000 mg | ORAL_TABLET | Freq: Once | ORAL | Status: AC
  Administered 2016-06-15: 400 mg via ORAL

## 2016-06-15 NOTE — Progress Notes (Signed)
Subjective:    Patient ID: Michele Douglas, female    DOB: 1990/07/03, 26 y.o.   MRN: [id number]  HPI Michele Douglas is a 26 y.o. female  Presents with R pinky finger pain - onset this morning.  Was using restroom, and finger got caught on end of sink as binging down pants.  Felt finger bent backward. No known pop.  Noticed pain in middle of finger and top of end of finger. Initially had some numbness on finger that has now improved. Had some difficulty bending and swelling and still appears swollen and trouble bending/flexing.   Tx: advil  - took 2 at 0930.  Helped some with pain. Ice also applied once.    R hand dominant. 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 No prior injury to 5th finger.   Patient Active Problem List   Diagnosis Date Noted  . Pseudotumor cerebri 04/21/2015  . Common migraine 02/20/2015  . Benign essential tremor 02/16/2015  . Intermittent vertigo 06/03/2014  . 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 Static tremor 04/17/2012   Past Medical History:  Diagnosis Date  . Asthma   . Essential tremor   . GERD (gastroesophageal reflux disease)   . Lactose intolerance   . 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 Pseudotumor cerebri   . Tremor    Past Surgical History:  Procedure Laterality Date  . BREAST SURGERY  2006   cystectomy  . TYMPANOPLASTY     left x 2   Allergies  Allergen Reactions  . Lactose Other (See Comments)    Sick very very bad   . Diamox [Acetazolamide] Other (See Comments)    Tingling in fingers and lips   Prior to Admission medications   Medication Sig Start Date End Date Taking? Authorizing Provider  albuterol (PROVENTIL HFA;VENTOLIN HFA) 108 (90 Base) MCG/ACT inhaler Inhale 1-2 puffs into the lungs every 4 (four) hours as needed. 02/09/16  Yes Ofilia Neas, PA-C  beclomethasone (QVAR) 40 MCG/ACT inhaler Inhale 4 puffs into the  lungs 2 (two) times daily. 04/27/16  Yes Ofilia Neas, PA-C  fluticasone (FLONASE) 50 MCG/ACT nasal spray Place 2 sprays into both nostrils daily. 02/09/16  Yes Ofilia Neas, PA-C  furosemide (LASIX) 20 MG tablet TAKE 1 TABLET(20 MG) BY MOUTH DAILY 12/25/15  Yes York Spaniel, MD  omeprazole (PRILOSEC) 20 MG capsule Take 20 mg by mouth daily.   Yes Historical Provider, MD  ondansetron (ZOFRAN) 4 MG tablet Take 1 tablet (4 mg total) by mouth every 8 (eight) hours as needed for nausea or vomiting. 10/08/15  Yes Butch Penny, NP  potassium chloride (K-DUR) 10 MEQ tablet TAKE 1 TABLET(10 MEQ) BY MOUTH TWICE DAILY 12/25/15  Yes York Spaniel, MD  propranolol ER (INDERAL LA) 60 MG 24 hr capsule Take 60 mg by mouth daily. 01/27/15  Yes Historical Provider, MD  topiramate (TOPAMAX) 50 MG tablet TAKE 1 TABLET BY MOUTH EVERY MORNING AND 2 TABLETS EVERY EVENING 03/23/16  Yes Butch Penny, NP  triamcinolone cream (KENALOG) 0.1 % Apply 1 application topically 2 (two) times daily. 08/23/14  Yes Shade Flood, MD  levocetirizine (XYZAL) 5 MG tablet Take 1 tablet (5 mg total) by mouth every evening. Patient not taking: Reported on 06/15/2016 02/09/16   Ofilia Neas, PA-C  Peak Flow Meter DEVI Once well establish a baseline. Patient not taking: Reported on 06/15/2016 02/09/16  Ofilia Neas, PA-C   Social History   Social History  . Marital status: Single    Spouse name: N/A  . Number of children: 0  . Years of education: college   Occupational History  . Not on file.   Social History Main Topics  . Smoking status: Never Smoker  . Smokeless tobacco: Never Used  . Alcohol use 0.0 oz/week     Comment: Wine or mixed drink ocassionally  . Drug use: No  . Sexual activity: Not Currently    Birth control/ protection: None     Comment: 1st intercourse 26 yo-Fewer than 5 partners   Other Topics Concern  . Not on file   Social History Narrative   Patient drinks caffeine occasionally.    Patient is right handed.      Review of Systems  Musculoskeletal: Positive for arthralgias and joint swelling.  Skin: Negative for color change and wound.  Neurological: Positive for weakness (5th finger flexion).       Objective:   Physical Exam  Constitutional: She is oriented to person, place, and time. She appears well-developed and well-nourished. No distress.  Cardiovascular: Normal rate.   Pulmonary/Chest: Effort normal.  Musculoskeletal:       Right hand: She exhibits decreased range of motion, tenderness and swelling (min at dip and middle phaynx. ). She exhibits normal capillary refill and no deformity. Normal sensation noted. Decreased strength (at distal 5th phalynx flexion. ) noted.       Hands: Neurological: She is alert and oriented to person, place, and time.  NVI distally  Skin: Skin is warm and dry. No rash noted. No erythema.    Dg Finger Little Right  Result Date: 06/15/2016 CLINICAL DATA:  Finger injury bent backwards EXAM: RIGHT LITTLE FINGER 2+V COMPARISON:  None. FINDINGS: Negative for fracture. Mild hyperextension of the fifth PIP joint. No arthropathy. IMPRESSION: Hyperextension of the fifth PIP joint.  No fracture. Electronically Signed   By: Marlan Palau M.D.   On: 06/15/2016 19:05      Assessment & Plan:   Michele Douglas is a 26 y.o. female Pain in finger of right hand - Plan: DG Finger Little Right, ibuprofen (ADVIL,MOTRIN) tablet 400 mg Suspected hyperextension injury of fifth finger of dominant right hand. Exam concerning for possible flexor tendon avulsion at distal phalanx, and with location of pain and distal phalanx and PIP, type II injury possible. No volar hand/palm pain. No apparent avulsion seen on x-ray or fracture, but hyperextension of PIP. No apparent dislocation.  -Placed in stack splint with buddy taping to fourth phalynx. May try to transition her to a dorsal padded splint with slight flexion at the PIP and DIP in case there is a  flexor tendon injury. If unable to flex at the DIP in the next few days, will refer to hand surgeon for evaluation for possible flexor tendon avulsion.  -Ibuprofen over-the-counter as needed, 400-600 mg every 6 hours when necessary. 400 mg dose given in office.  Meds ordered this encounter  Medications  . ibuprofen (ADVIL,MOTRIN) tablet 400 mg   Patient Instructions    You may have a jammed finger, but as we discussed, I am concerned about the flexor tendon at the fingertip. Keep the brace and buddy tape in place for the next day or 2, then if still not able to bend that  finger, will refer you to hand specialist.   Ibuprofen 400-600 mg every 6 hours as needed, keep hand elevated tonight as  that can help with swelling and discomfort. I'll call you with the x-ray report once I receive it.    IF you received an x-ray today, you will receive an invoice from [facility]. Please contact [facility] at [phone] with questions or concerns regarding your invoice.   IF you received labwork today, you will receive an invoice from [place]. Please contact [facility] at [phone] with questions or concerns regarding your invoice.   Our billing staff will not be able to assist you with questions regarding Douglas from these companies.  You will be contacted with the lab results as soon as they are available. The fastest way to get your results is to activate your My Chart account. Instructions are located on the last page of this paperwork. If you have not heard from [place] regarding the results in 2 weeks, please contact this office.     Signed,   [name], MD [facility] at [facility].  06/15/16 7:45 PM

## 2016-06-15 NOTE — Patient Instructions (Addendum)
  You may have a jammed finger, but as we discussed, I am concerned about the flexor tendon at the fingertip. Keep the brace and buddy tape in place for the next day or 2, then if still not able to bend that  finger, will refer you to hand specialist.   Ibuprofen 400-600 mg every 6 hours as needed, keep hand elevated tonight as that can help with swelling and discomfort. I'll call you with the x-ray report once I receive it.    IF you received an x-ray today, you will receive an invoice from [facility]. Please contact [facility] at [phone] with questions or concerns regarding your invoice.   IF you received labwork today, you will receive an invoice from [place]. Please contact [facility] at [phone] with questions or concerns regarding your invoice.   Our billing staff will not be able to assist you with questions regarding bills from these companies.  You will be contacted with the lab results as soon as they are available. The fastest way to get your results is to activate your My Chart account. Instructions are located on the last page of this paperwork. If you have not heard from [place] regarding the results in 2 weeks, please contact this office.

## 2016-06-24 ENCOUNTER — Other Ambulatory Visit: Payer: Self-pay | Admitting: Neurology

## 2016-06-25 ENCOUNTER — Telehealth: Payer: Self-pay | Admitting: Family Medicine

## 2016-06-25 DIAGNOSIS — S6992XD Unspecified injury of left wrist, hand and finger(s), subsequent encounter: Secondary | ICD-10-CM

## 2016-06-25 NOTE — Telephone Encounter (Signed)
10d s/p right 5th finger forceful extension injury. Has been splinted since. Still unable to move DIP joint at all and minimal movement in PIP. Refer to hand surgery for further eval asap

## 2016-07-22 ENCOUNTER — Other Ambulatory Visit: Payer: Self-pay | Admitting: Family Medicine

## 2016-07-22 DIAGNOSIS — L259 Unspecified contact dermatitis, unspecified cause: Secondary | ICD-10-CM

## 2016-07-26 NOTE — Telephone Encounter (Signed)
Intermittent dermatitis with rare use of triamcinolone cream. See office visit 08/23/2014. Refilled. Can discuss further next visit if any changes or more frequent dosing.

## 2016-08-10 ENCOUNTER — Other Ambulatory Visit: Payer: Self-pay | Admitting: Orthopedic Surgery

## 2016-08-10 DIAGNOSIS — S6991XA Unspecified injury of right wrist, hand and finger(s), initial encounter: Secondary | ICD-10-CM

## 2016-08-17 ENCOUNTER — Ambulatory Visit
Admission: RE | Admit: 2016-08-17 | Discharge: 2016-08-17 | Disposition: A | Discharge status: Home / Self Care | Payer: BLUE CROSS/BLUE SHIELD | Source: Ambulatory Visit | Attending: Orthopedic Surgery | Admitting: Orthopedic Surgery

## 2016-08-17 DIAGNOSIS — S6991XA Unspecified injury of right wrist, hand and finger(s), initial encounter: Secondary | ICD-10-CM

## 2016-08-23 ENCOUNTER — Ambulatory Visit (INDEPENDENT_AMBULATORY_CARE_PROVIDER_SITE_OTHER): Payer: BLUE CROSS/BLUE SHIELD | Admitting: Gynecology

## 2016-08-23 ENCOUNTER — Encounter: Payer: Self-pay | Admitting: Gynecology

## 2016-08-23 VITALS — BP 124/78 | Ht 67.5 in | Wt 242.0 lb

## 2016-08-23 DIAGNOSIS — R6889 Other general symptoms and signs: Secondary | ICD-10-CM | POA: Diagnosis not present

## 2016-08-23 DIAGNOSIS — N946 Dysmenorrhea, unspecified: Secondary | ICD-10-CM | POA: Diagnosis not present

## 2016-08-23 DIAGNOSIS — Z13220 Encounter for screening for lipoid disorders: Secondary | ICD-10-CM

## 2016-08-23 DIAGNOSIS — Z01411 Encounter for gynecological examination (general) (routine) with abnormal findings: Secondary | ICD-10-CM

## 2016-08-23 LAB — CBC WITH DIFFERENTIAL/PLATELET
Basophils Absolute: 130 cells/uL (ref 0–200)
Basophils Relative: 2 %
Eosinophils Absolute: 195 cells/uL (ref 15–500)
Eosinophils Relative: 3 %
HEMATOCRIT: 39.1 % (ref 35.0–45.0)
Hemoglobin: 12.6 g/dL (ref 11.7–15.5)
LYMPHS PCT: 37 %
Lymphs Abs: 2405 cells/uL (ref 850–3900)
MCH: 28.6 pg (ref 27.0–33.0)
MCHC: 32.2 g/dL (ref 32.0–36.0)
MCV: 88.9 fL (ref 80.0–100.0)
MONO ABS: 520 {cells}/uL (ref 200–950)
MPV: 10.3 fL (ref 7.5–12.5)
Monocytes Relative: 8 %
NEUTROS PCT: 50 %
Neutro Abs: 3250 cells/uL (ref 1500–7800)
Platelets: 337 10*3/uL (ref 140–400)
RBC: 4.4 MIL/uL (ref 3.80–5.10)
RDW: 14 % (ref 11.0–15.0)
WBC: 6.5 10*3/uL (ref 3.8–10.8)

## 2016-08-23 LAB — COMPREHENSIVE METABOLIC PANEL
ALT: 6 U/L (ref 6–29)
AST: 10 U/L (ref 10–30)
Albumin: 4 g/dL (ref 3.6–5.1)
Alkaline Phosphatase: 68 U/L (ref 33–115)
BUN: 12 mg/dL (ref 7–25)
CALCIUM: 9.1 mg/dL (ref 8.6–10.2)
CO2: 17 mmol/L — AB (ref 20–31)
Chloride: 110 mmol/L (ref 98–110)
Creat: 0.94 mg/dL (ref 0.50–1.10)
GLUCOSE: 90 mg/dL (ref 65–99)
POTASSIUM: 3.9 mmol/L (ref 3.5–5.3)
Sodium: 139 mmol/L (ref 135–146)
Total Bilirubin: 0.6 mg/dL (ref 0.2–1.2)
Total Protein: 6.7 g/dL (ref 6.1–8.1)

## 2016-08-23 LAB — LIPID PANEL
CHOL/HDL RATIO: 4 ratio (ref <5.0)
Cholesterol: 132 mg/dL (ref <200)
HDL: 33 mg/dL — AB (ref >50)
LDL CALC: 81 mg/dL (ref <100)
Triglycerides: 89 mg/dL (ref <150)
VLDL: 18 mg/dL (ref <30)

## 2016-08-23 LAB — TSH: TSH: 0.99 m[IU]/L

## 2016-08-23 MED ORDER — IBUPROFEN 800 MG PO TABS: 800.0000 mg | ORAL_TABLET | Freq: Three times a day (TID) | ORAL | 1 refills | Status: DC | PRN

## 2016-08-23 NOTE — Addendum Note (Signed)
Addended by: Dayna BarkerGARDNER, KIMBERLY K on: 08/23/2016 10:25 AM   Modules accepted: Orders

## 2016-08-23 NOTE — Progress Notes (Signed)
    Michele Douglas Jan 21, 1991 [id number]        26 y.o.  G0P0 for annual exam.  Also complaining of:  1. Feeling cold all the time when everybody else feels normal. No weight changes hair or skin changes. No rashes nausea vomiting diarrhea constipation. 2. Increasing dysmenorrhea and heaviness of her flow. Patient notes menses usually 3 days and relatively light. Now lasting 5 days with significant cramping on day 2. No bleeding or pain in between her cycles. Not sexually active and not using contraception.  Past medical history,surgical history, problem list, medications, allergies, family history and social history were all reviewed and documented as reviewed in the EPIC chart.  ROS:  Performed with pertinent positives and negatives included in the history, assessment and plan.   Additional significant findings :  None   Exam: Kennon PortelaKim Gardner assistant Vitals:   08/23/16 0916  BP: 124/78  Weight: 242 lb (109.8 kg)  Height: 5' 7.5" (1.715 m)   Body mass index is 37.34 kg/m.  General appearance:  Normal affect, orientation and appearance. Skin: Grossly normal HEENT: Without gross lesions.  No cervical or supraclavicular adenopathy. Thyroid normal.  Lungs:  Clear without wheezing, rales or rhonchi Cardiac: RR, without RMG Abdominal:  Soft, nontender, without masses, guarding, rebound, organomegaly or hernia Breasts:  Examined lying and sitting without masses, retractions, discharge or axillary adenopathy. Pelvic:  Ext, BUS, Vagina: Normal  Cervix: Normal  Uterus: Anteverted, normal size, shape and contour, midline and mobile nontender   Adnexa: Without masses or tenderness    Anus and perineum: Normal     Assessment/Plan:  26 y.o. G0P0 female for annual exam regular menses, not sexually active.   1. Cold intolerance. Feels cold when everybody else feels warm. No other symptoms to suggest disease. Will check baseline CBC comprehensive metabolic panel and TSH. Differential reviewed  with the patient. If all labs normal them will monitor at present. Will follow up with primary physician if continues. 2. Dysmenorrhea with slightly longer cycles. Exam is normal. Discussed hormonal changes with aging versus pathology such as endometriosis/adenomyosis. Will start with ibuprofen 800 mg every 8 hours for the first several days of her cycle to see if this does not help. If symptoms would persist or certainly worsen despite this she is going to call and we will start with ultrasound. Possibilities to include hormonal manipulation up to and including laparoscopy discussed. 3. Pap smear 2017. Pap smear done today. History of LGSIL 2015. Colposcopy and biopsy LGSIL with negative ECC. Pap smear/HPV 2016 negative. Pap smear 2017 normal. If this Pap smear returns normal then plan less frequent screening intervals per current screening guidelines. 4. Contraception not an issue as she remains abstinent and plans to continue. 5. Health maintenance. Patient requests baseline labs. Is fasting today. Lipid profile done with above blood work. Follow up for lab results. Follow up if dysmenorrhea continues. Follow up in one year for annual exam.  Additional time in excess of her routine gynecologic exam was spent in direct face to face counseling and coordination of care in regards to her cold intolerance and increasing dysmenorrhea/menstrual length.    Dara LordsFONTAINE,Shalisha Clausing P MD, 9:47 AM 08/23/2016

## 2016-08-23 NOTE — Patient Instructions (Signed)
Start on the ibuprofen for the menstrual cramps. Call if the pain continues despite this and we will plan on scheduling an ultrasound.

## 2016-08-24 LAB — PAP IG W/ RFLX HPV ASCU

## 2016-08-27 ENCOUNTER — Telehealth: Payer: Self-pay

## 2016-08-27 NOTE — Telephone Encounter (Signed)
Her HDL is low because she does not exercise on a regular basis. This is the component of the cholesterol that responds to exercise and is the good component. The remainder of the study was normal. Her CO2 is minimally low which is not unusual to see and has no clinical significance. It probably would be normal with a repeat but it is not worth repeating it.

## 2016-08-27 NOTE — Telephone Encounter (Signed)
Patient informed. 

## 2016-08-27 NOTE — Telephone Encounter (Signed)
Patient saw her blood work results in My Chart. Questioning the abnormal results she sees HDL and C02. Rec?

## 2016-09-27 ENCOUNTER — Ambulatory Visit (INDEPENDENT_AMBULATORY_CARE_PROVIDER_SITE_OTHER): Payer: BLUE CROSS/BLUE SHIELD | Admitting: Neurology

## 2016-09-27 ENCOUNTER — Encounter: Payer: Self-pay | Admitting: Neurology

## 2016-09-27 VITALS — BP 140/92 | HR 71 | Ht 67.5 in | Wt 244.5 lb

## 2016-09-27 DIAGNOSIS — G43009 Migraine without aura, not intractable, without status migrainosus: Secondary | ICD-10-CM

## 2016-09-27 DIAGNOSIS — G932 Benign intracranial hypertension: Secondary | ICD-10-CM | POA: Diagnosis not present

## 2016-09-27 MED ORDER — ONDANSETRON HCL 4 MG PO TABS: 4.0000 mg | ORAL_TABLET | Freq: Three times a day (TID) | ORAL | 3 refills | Status: DC | PRN

## 2016-09-27 NOTE — Progress Notes (Signed)
Reason for visit: Pseudotumor cerebri  Michele Douglas is an 26 y.o. female  History of present illness:  [name] is a 26 year old right-handed black female with a history of pseudotumor cerebri. The patient has been on Topamax taking 50 mg in the morning and 100 mg in evening and she is tolerating this fairly well. The patient does not have headaches at this point, she was noted to have a sinus issue earlier, but this has been resolved. The patient does have episodes of vertigo and nausea, this occurs on occasion unassociated with headache, the patient gets benefit with taking Zofran for this. The patient is on potassium and Lasix. The patient does have an essential tremor, she takes propranolol for this. She returns to this office for an evaluation.  Past Medical History:  Diagnosis Date  . Asthma   . Essential tremor   . GERD (gastroesophageal reflux disease)   . Lactose intolerance   . 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 Pseudotumor cerebri   . Tremor     Past Surgical History:  Procedure Laterality Date  . BREAST SURGERY  2006   cystectomy  . TYMPANOPLASTY     left x 2    Family History  Problem Relation Age of Onset  . Hypertension Mother   . Migraines Mother   . AAA (abdominal aortic aneurysm) Father   . Migraines Sister   . Myasthenia gravis Sister     Social history:  reports that she has never smoked. She has never used smokeless tobacco. She reports that she drinks alcohol. She reports that she does not use drugs.    Allergies  Allergen Reactions  . Lactose Other (See Comments)    Nausea, vomiting, diarrhea.   . Diamox [Acetazolamide] Other (See Comments)    Tingling in fingers and lips    Medications:  Prior to Admission medications   Medication Sig Start Date End Date Taking? Authorizing Provider  albuterol (PROVENTIL HFA;VENTOLIN HFA) 108 (90 Base) MCG/ACT inhaler Inhale 1-2  puffs into the lungs every 4 (four) hours as needed. 02/09/16  Yes Ofilia Neaslark, Michael L, PA-C  beclomethasone (QVAR) 40 MCG/ACT inhaler Inhale 4 puffs into the lungs 2 (two) times daily. 04/27/16  Yes Ofilia Neaslark, Michael L, PA-C  fluticasone (FLONASE) 50 MCG/ACT nasal spray Place 2 sprays into both nostrils daily. 02/09/16  Yes Ofilia Neaslark, Michael L, PA-C  furosemide (LASIX) 20 MG tablet TAKE 1 TABLET(20 MG) BY MOUTH DAILY 06/24/16  Yes York SpanielWillis, Charles K, MD  ibuprofen (ADVIL,MOTRIN) 800 MG tablet Take 1 tablet (800 mg total) by mouth every 8 (eight) hours as needed. For menstrual pain 08/23/16  Yes Fontaine, Nadyne Coombesimothy P, MD  levocetirizine (XYZAL) 5 MG tablet Take 1 tablet (5 mg total) by mouth every evening. 02/09/16  Yes Ofilia Neaslark, Michael L, PA-C  omeprazole (PRILOSEC) 20 MG capsule Take 20 mg by mouth every other day.    Yes [provider]  ondansetron (ZOFRAN) 4 MG tablet Take 1 tablet (4 mg total) by mouth every 8 (eight) hours as needed for nausea or vomiting. 10/08/15  Yes Butch PennyMillikan, Megan, NP  Peak Flow Meter DEVI Once well establish a baseline. 02/09/16  Yes Ofilia Neaslark, Michael L, PA-C  potassium chloride (K-DUR) 10 MEQ tablet TAKE 1 TABLET(10 MEQ) BY MOUTH TWICE DAILY 06/24/16  Yes York SpanielWillis, Charles K, MD  propranolol ER (INDERAL LA) 60 MG 24 hr capsule Take 60 mg by mouth daily. 01/27/15  Yes  [provider]  topiramate (TOPAMAX) 50 MG tablet TAKE 1 TABLET BY MOUTH EVERY MORNING AND 2 TABLETS EVERY EVENING 03/23/16  Yes Butch PennyMillikan, Megan, NP  triamcinolone cream (KENALOG) 0.1 % APPLY EXTERNALLY TO THE AFFECTED AREA TWICE DAILY 07/26/16  Yes Shade FloodGreene, Jeffrey R, MD    ROS:  Out of a complete 14 system review of symptoms, the patient complains only of the following symptoms, and all other reviewed systems are negative.  Blurred vision Flushing Abdominal pain, nausea Intermittent dizziness  Blood pressure (!) 140/92, pulse 71, height 5' 7.5" (1.715 m), weight 244 lb 8 oz (110.9 kg).  Physical  Exam  General: The patient is alert and cooperative at the time of the examination. The patient is moderately obese.  Skin: No significant peripheral edema is noted.   Neurologic Exam  Mental status: The patient is alert and oriented x 3 at the time of the examination. The patient has apparent normal recent and remote memory, with an apparently normal attention span and concentration ability.   Cranial nerves: Facial symmetry is present. Speech is normal, no aphasia or dysarthria is noted. Extraocular movements are full. Visual fields are full.  Motor: The patient has good strength in all 4 extremities.  Sensory examination: Soft touch sensation is symmetric on the face, arms, and legs.  Coordination: The patient has good finger-nose-finger and heel-to-shin bilaterally.  Gait and station: The patient has a normal gait. Tandem gait is normal. Romberg is negative. No drift is seen.  Reflexes: Deep tendon reflexes are symmetric.   Assessment/Plan:  1. Pseudotumor cerebri  2. Migraine headache  3. Intermittent vertigo  4. Essential tremor  The patient will remain on Topamax, she will be given a prescription for Zofran. She will be taken off of Lasix and potassium supplementation. She will follow-up in about 6 months. She will call if the headaches seem to worsen. Currently she claims that she is not having any headache. The episodes of vertigo could potentially be associated with a migrainous process.  [name] MD 09/27/2016 1:59 PM  [facility] [address] [place], [place] [id number]  Phone [phone] Fax [phone]

## 2016-09-27 NOTE — Patient Instructions (Signed)
   We will stop the furosemide and the potassium suplimentation.  Call if you think that the headaches are coming back.

## 2016-10-02 ENCOUNTER — Ambulatory Visit (INDEPENDENT_AMBULATORY_CARE_PROVIDER_SITE_OTHER): Payer: BLUE CROSS/BLUE SHIELD | Admitting: Physician Assistant

## 2016-10-02 ENCOUNTER — Ambulatory Visit (INDEPENDENT_AMBULATORY_CARE_PROVIDER_SITE_OTHER): Payer: BLUE CROSS/BLUE SHIELD

## 2016-10-02 ENCOUNTER — Encounter: Payer: Self-pay | Admitting: Physician Assistant

## 2016-10-02 VITALS — BP 117/82 | HR 71 | Temp 98.0°F | Resp 18 | Ht 66.93 in | Wt 243.0 lb

## 2016-10-02 DIAGNOSIS — R109 Unspecified abdominal pain: Secondary | ICD-10-CM

## 2016-10-02 DIAGNOSIS — R197 Diarrhea, unspecified: Secondary | ICD-10-CM | POA: Diagnosis not present

## 2016-10-02 DIAGNOSIS — K5900 Constipation, unspecified: Secondary | ICD-10-CM | POA: Diagnosis not present

## 2016-10-02 LAB — POCT CBC
Granulocyte percent: 65.2 %G (ref 37–80)
HEMATOCRIT: 37 % — AB (ref 37.7–47.9)
Hemoglobin: 12.5 g/dL (ref 12.2–16.2)
LYMPH, POC: 2.5 (ref 0.6–3.4)
MCH, POC: 29.3 pg (ref 27–31.2)
MCHC: 33.6 g/dL (ref 31.8–35.4)
MCV: 87 fL (ref 80–97)
MID (CBC): 0.2 (ref 0–0.9)
MPV: 8.1 fL (ref 0–99.8)
POC GRANULOCYTE: 5.1 (ref 2–6.9)
POC LYMPH %: 32.1 % (ref 10–50)
POC MID %: 2.7 % (ref 0–12)
Platelet Count, POC: 381 10*3/uL (ref 142–424)
RBC: 4.26 M/uL (ref 4.04–5.48)
RDW, POC: 13.5 %
WBC: 7.8 10*3/uL (ref 4.6–10.2)

## 2016-10-02 MED ORDER — POLYETHYLENE GLYCOL 3350 17 GM/SCOOP PO POWD: 17.0000 g | Freq: Two times a day (BID) | ORAL | 1 refills | Status: DC | PRN

## 2016-10-02 MED ORDER — DICYCLOMINE HCL 10 MG PO CAPS: 10.0000 mg | ORAL_CAPSULE | Freq: Three times a day (TID) | ORAL | 0 refills | Status: DC

## 2016-10-02 NOTE — Patient Instructions (Addendum)
For constipation   Make sure you are drinking enough water daily. Make sure you are getting enough fiber in your diet - this will make you regular - you can eat high fiber foods or use metamucil as a supplement - it is really important to drink enough water when using fiber supplements.    For more aggressive treatment of constipation Use 4 capfuls of Colace and 6 doses of Miralax and drink it in 2 hours - this should result in several watery stools - if it does not repeat the next day and then go to daily miralax for a week to make sure your bowels are clean and retrained to work properly  For the most aggressive treatment of constipation Use 14 capfuls of Miralax in 1 gallon of fluid (gatoraid or water work well or a combination of the two) and drink over 12h - it is ok to eat during this time and then use Miralax 1 capful daily for about 2 weeks to prevent the constipation from returning     IF you received an x-ray today, you will receive an invoice from [facility]. Please contact [facility] at [phone] with questions or concerns regarding your invoice.   IF you received labwork today, you will receive an invoice from [facility]. Please contact [facility] at [phone] with questions or concerns regarding your invoice.   Our billing staff will not be able to assist you with questions regarding bills from these companies.  You will be contacted with the lab results as soon as they are available. The fastest way to get your results is to activate your My Chart account. Instructions are located on the last page of this paperwork. If you have not heard from us regarding the results in 2 weeks, please contact this office.

## 2016-10-02 NOTE — Progress Notes (Signed)
Michele Douglas  MRN: [id number] DOB: 1990-06-28  PCP: Shade Flood, MD  Chief Complaint  Patient presents with  . Abdominal Pain    x 2 weeks   . Nausea    with Diarrhea    Subjective:  Pt presents to clinic for 2 weeks of abdominal pain with diarrhea for a week - 5 episode today - like water for a week. She is having abdominal cramping that does improve after the diarrhea.  Pain is aching and burning and twisting pain. Pain started epigastric region - she used tums which only helped very strange - she would not have called it heartburn and now the pain is left mid abdomin that moves to her flank.  When this 1st started she thought she might be constipated because she was not going as often - she changed her diet with increase in fiber and then diarrhea started.  No blood in stool.  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  Eating has returned to normal.  Pt does not have heartburn - she is on prilosec from her potassium pill.  History is obtained by patient.  Review of Systems  Constitutional: Positive for chills. Negative for fever.  Gastrointestinal: Positive for abdominal pain, diarrhea and nausea (using zofran). Negative for blood in stool, constipation (thought maybe at beginning) and vomiting.  Genitourinary: Negative.   Musculoskeletal: Negative for myalgias.  Neurological: Negative for headaches.    Patient Active Problem List   Diagnosis Date Noted  . Pseudotumor cerebri 04/21/2015  . Common migraine 02/20/2015  . Benign essential tremor 02/16/2015  . Intermittent vertigo 06/03/2014  . 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 Static tremor 04/17/2012    Current Outpatient Prescriptions on File Prior to Visit  Medication Sig Dispense Refill  . albuterol (PROVENTIL HFA;VENTOLIN HFA) 108 (90 Base) MCG/ACT inhaler Inhale 1-2 puffs into the lungs every 4 (four) hours as needed. 1 Inhaler 1  . beclomethasone (QVAR) 40  MCG/ACT inhaler Inhale 4 puffs into the lungs 2 (two) times daily. 1 Inhaler 12  . fluticasone (FLONASE) 50 MCG/ACT nasal spray Place 2 sprays into both nostrils daily. 16 g 6  . ibuprofen (ADVIL,MOTRIN) 800 MG tablet Take 1 tablet (800 mg total) by mouth every 8 (eight) hours as needed. For menstrual pain 60 tablet 1  . levocetirizine (XYZAL) 5 MG tablet Take 1 tablet (5 mg total) by mouth every evening. 30 tablet 11  . omeprazole (PRILOSEC) 20 MG capsule Take 20 mg by mouth every other day.     . ondansetron (ZOFRAN) 4 MG tablet Take 1 tablet (4 mg total) by mouth every 8 (eight) hours as needed for nausea or vomiting. 30 tablet 3  . Peak Flow Meter DEVI Once well establish a baseline. 1 each 0  . propranolol ER (INDERAL LA) 60 MG 24 hr capsule Take 60 mg by mouth daily.  2  . topiramate (TOPAMAX) 50 MG tablet TAKE 1 TABLET BY MOUTH EVERY MORNING AND 2 TABLETS EVERY EVENING 90 tablet 6  . triamcinolone cream (KENALOG) 0.1 % APPLY EXTERNALLY TO THE AFFECTED AREA TWICE DAILY 30 g 0   No current facility-administered medications on file prior to visit.     Allergies  Allergen Reactions  . Lactose Other (See Comments)    Nausea, vomiting, diarrhea.   . Diamox [Acetazolamide] Other (See Comments)    Tingling in fingers and lips    Past Medical History:  Diagnosis Date  . Asthma   .  Essential tremor   . GERD (gastroesophageal reflux disease)   . Lactose intolerance   . 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 Pseudotumor cerebri   . Tremor    Social History   Social History Narrative   Patient drinks caffeine occasionally.   Patient is right handed.    Social History  Substance Use Topics  . Smoking status: Never Smoker  . Smokeless tobacco: Never Used  . Alcohol use 0.0 oz/week     Comment: Wine or mixed drink ocassionally   family history includes AAA (abdominal aortic aneurysm) in her father; Hypertension  in her mother; Migraines in her mother and sister; Myasthenia gravis in her sister.     Objective:  BP 117/82   Pulse 71   Temp 98 F (36.7 C)   Resp 18   Ht 5' 6.93" (1.7 m)   Wt 243 lb (110.2 kg)   LMP 09/08/2016   SpO2 100%   BMI 38.14 kg/m  Body mass index is 38.14 kg/m.  Physical Exam  Constitutional: She is oriented to person, place, and time and well-developed, well-nourished, and in no distress.  HENT:  Head: Normocephalic and atraumatic.  Right Ear: Hearing and external ear normal.  Left Ear: Hearing and external ear normal.  Eyes: Conjunctivae are normal.  Neck: Normal range of motion.  Cardiovascular: Normal rate, regular rhythm and normal heart sounds.   No murmur heard. Pulmonary/Chest: Effort normal and breath sounds normal. She has no wheezes.  Abdominal: Soft. Bowel sounds are normal. There is tenderness (generalized TTP).  Neurological: She is alert and oriented to person, place, and time. Gait normal.  Skin: Skin is warm and dry.  Psychiatric: Mood, memory, affect and judgment normal.  Vitals reviewed.   Results for orders placed or performed in visit on 10/02/16  POCT CBC  Result Value Ref Range   WBC 7.8 4.6 - 10.2 K/uL   Lymph, poc 2.5 0.6 - 3.4   POC LYMPH PERCENT 32.1 10 - 50 %L   MID (cbc) 0.2 0 - 0.9   POC MID % 2.7 0 - 12 %M   POC Granulocyte 5.1 2 - 6.9   Granulocyte percent 65.2 37 - 80 %G   RBC 4.26 4.04 - 5.48 M/uL   Hemoglobin 12.5 12.2 - 16.2 g/dL   HCT, POC 45.437.0 (A) 09.837.7 - 47.9 %   MCV 87.0 80 - 97 fL   MCH, POC 29.3 27 - 31.2 pg   MCHC 33.6 31.8 - 35.4 g/dL   RDW, POC 11.913.5 %   Platelet Count, POC 381 142 - 424 K/uL   MPV 8.1 0 - 99.8 fL   Dg Abd 1 View  Result Date: 10/02/2016 CLINICAL DATA:  Abdominal pain for 2 weeks with constipation. EXAM: ABDOMEN - 1 VIEW COMPARISON:  CT abdomen and pelvis 03/11/2012. FINDINGS: The bowel gas pattern is nonobstructive. Moderate to moderately large stool burden in the ascending colon is  identified. No abnormal abdominal calcification or focal bony abnormality. IMPRESSION: Moderate to moderately large stool burden ascending colon. Otherwise negative. Electronically Signed   By: Drusilla Kannerhomas  Dalessio M.D.   On: 10/02/2016 15:12    Assessment and Plan :  Abdominal pain, unspecified abdominal location - Plan: DG Abd 1 View, POCT CBC  Diarrhea, unspecified type - Plan: GI Profile, Stool, PCR  Abdominal cramping - Plan: dicyclomine (BENTYL) 10 MG capsule  Constipation, unspecified constipation type - Plan: polyethylene glycol powder (  GLYCOLAX/MIRALAX) powder   Ongoing abd pain likely to be from constipation - as [facility] is normal making infection less likely. Diarrhea suspect to be stool that is able to pass by the ascending colon stool burden - if the diarrhea does not resolve after a colon clean out then stool samples need to be obtained.  She was instructed on stool clean procedure and then 2 weeks following to return bowels to good health - continue good fiber intake and fluid intake.  Benny LennertSarah Kiley Solimine PA-C  [facility] 10/04/2016 9:21 AM

## 2016-10-18 ENCOUNTER — Other Ambulatory Visit: Payer: Self-pay | Admitting: Adult Health

## 2016-11-07 ENCOUNTER — Emergency Department (HOSPITAL_COMMUNITY): Payer: BLUE CROSS/BLUE SHIELD

## 2016-11-07 ENCOUNTER — Emergency Department (HOSPITAL_COMMUNITY)
Admission: EM | Admit: 2016-11-07 | Discharge: 2016-11-07 | Disposition: A | Discharge status: Home / Self Care | Payer: BLUE CROSS/BLUE SHIELD | Attending: Emergency Medicine | Admitting: Emergency Medicine

## 2016-11-07 ENCOUNTER — Encounter (HOSPITAL_COMMUNITY): Payer: Self-pay

## 2016-11-07 DIAGNOSIS — R197 Diarrhea, unspecified: Secondary | ICD-10-CM | POA: Diagnosis not present

## 2016-11-07 DIAGNOSIS — Z79899 Other long term (current) drug therapy: Secondary | ICD-10-CM | POA: Insufficient documentation

## 2016-11-07 DIAGNOSIS — R109 Unspecified abdominal pain: Secondary | ICD-10-CM

## 2016-11-07 DIAGNOSIS — J45909 Unspecified asthma, uncomplicated: Secondary | ICD-10-CM | POA: Diagnosis not present

## 2016-11-07 DIAGNOSIS — R1013 Epigastric pain: Secondary | ICD-10-CM | POA: Diagnosis present

## 2016-11-07 LAB — URINALYSIS, ROUTINE W REFLEX MICROSCOPIC
Bilirubin Urine: NEGATIVE
GLUCOSE, UA: NEGATIVE mg/dL
Hgb urine dipstick: NEGATIVE
Ketones, ur: NEGATIVE mg/dL
LEUKOCYTES UA: NEGATIVE
NITRITE: NEGATIVE
PROTEIN: NEGATIVE mg/dL
Specific Gravity, Urine: 1.013 (ref 1.005–1.030)
pH: 5 (ref 5.0–8.0)

## 2016-11-07 LAB — COMPREHENSIVE METABOLIC PANEL
ALBUMIN: 3.8 g/dL (ref 3.5–5.0)
ALK PHOS: 78 U/L (ref 38–126)
ALT: 10 U/L — AB (ref 14–54)
AST: 17 U/L (ref 15–41)
Anion gap: 8 (ref 5–15)
BUN: 7 mg/dL (ref 6–20)
CO2: 17 mmol/L — ABNORMAL LOW (ref 22–32)
CREATININE: 0.9 mg/dL (ref 0.44–1.00)
Calcium: 8.8 mg/dL — ABNORMAL LOW (ref 8.9–10.3)
Chloride: 112 mmol/L — ABNORMAL HIGH (ref 101–111)
Glucose, Bld: 101 mg/dL — ABNORMAL HIGH (ref 65–99)
POTASSIUM: 3.8 mmol/L (ref 3.5–5.1)
SODIUM: 137 mmol/L (ref 135–145)
Total Bilirubin: 0.8 mg/dL (ref 0.3–1.2)
Total Protein: 7.6 g/dL (ref 6.5–8.1)

## 2016-11-07 LAB — CBC
HEMATOCRIT: 38.2 % (ref 36.0–46.0)
Hemoglobin: 12.8 g/dL (ref 12.0–15.0)
MCH: 29 pg (ref 26.0–34.0)
MCHC: 33.5 g/dL (ref 30.0–36.0)
MCV: 86.4 fL (ref 78.0–100.0)
PLATELETS: 317 10*3/uL (ref 150–400)
RBC: 4.42 MIL/uL (ref 3.87–5.11)
RDW: 13 % (ref 11.5–15.5)
WBC: 5.3 10*3/uL (ref 4.0–10.5)

## 2016-11-07 LAB — LIPASE, BLOOD: LIPASE: 24 U/L (ref 11–51)

## 2016-11-07 LAB — I-STAT BETA HCG BLOOD, ED (MC, WL, AP ONLY)

## 2016-11-07 MED ORDER — KETOROLAC TROMETHAMINE 30 MG/ML IJ SOLN
30.0000 mg | Freq: Once | INTRAMUSCULAR | Status: AC
  Administered 2016-11-07: 30 mg via INTRAVENOUS
  Filled 2016-11-07: qty 1

## 2016-11-07 MED ORDER — SODIUM CHLORIDE 0.9 % IV BOLUS (SEPSIS)
1000.0000 mL | Freq: Once | INTRAVENOUS | Status: AC
  Administered 2016-11-07: 1000 mL via INTRAVENOUS

## 2016-11-07 MED ORDER — FAMOTIDINE IN NACL 20-0.9 MG/50ML-% IV SOLN
20.0000 mg | Freq: Once | INTRAVENOUS | Status: AC
  Administered 2016-11-07: 20 mg via INTRAVENOUS
  Filled 2016-11-07: qty 50

## 2016-11-07 MED ORDER — ONDANSETRON HCL 4 MG/2ML IJ SOLN
4.0000 mg | Freq: Once | INTRAMUSCULAR | Status: AC
  Administered 2016-11-07: 4 mg via INTRAVENOUS
  Filled 2016-11-07: qty 2

## 2016-11-07 MED ORDER — DICYCLOMINE HCL 10 MG PO CAPS
10.0000 mg | ORAL_CAPSULE | Freq: Once | ORAL | Status: AC
  Administered 2016-11-07: 10 mg via ORAL
  Filled 2016-11-07: qty 1

## 2016-11-07 MED ORDER — ONDANSETRON 4 MG PO TBDP: ORAL_TABLET | ORAL | 0 refills | Status: DC

## 2016-11-07 MED ORDER — DICYCLOMINE HCL 10 MG PO CAPS: 10.0000 mg | ORAL_CAPSULE | Freq: Three times a day (TID) | ORAL | 0 refills | Status: DC | PRN

## 2016-11-07 NOTE — Discharge Instructions (Signed)
Stay hydrated.   Take zofran as needed for nausea,   Take bentyl for cramps.   Take 2 imodium tablets each time you have diarrhea, up to 10 times daily.   Take pepcid 20 mg twice daily as needed for epigastric pain. Take nexium 40 mg daily   See your doctor  Rest for 2-3 days   Return to ER if you have worse abdominal pain, vomiting, fever for a week, dehydration

## 2016-11-07 NOTE — ED Provider Notes (Signed)
[facility]-EMERGENCY DEPT Provider Note   CSN: [id number] Arrival date & time: 11/07/16  4098     History   Chief Complaint No chief complaint on file.   HPI Michele Douglas is a 26 y.o. female history of pseudotumor cerebri, constipation here presenting with abdominal pain, flank pain. Patient had acute onset of epigastric pain radiating to her flank earlier this morning around 3 AM. Also had some nausea and took some Zofran and had a fever 101 and took some  Tylenol prior to arrival. 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 Patient has no recent travel or bad food or uncooked food. Patient did have about 10 episodes of watery stools today, no hx of C diff. Patient was a constipated a month ago and took miralax and symptoms resolved until this morning. No hx of kidney stones or gallstones but had previous CXR that possible gallstones but never an ultrasound to confirm    The history is provided by the patient.    Past Medical History:  Diagnosis Date  . Asthma   . Essential tremor   . GERD (gastroesophageal reflux disease)   . Lactose intolerance   . 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 Pseudotumor cerebri   . Tremor     Patient Active Problem List   Diagnosis Date Noted  . Pseudotumor cerebri 04/21/2015  . Common migraine 02/20/2015  . Benign essential tremor 02/16/2015  . Intermittent vertigo 06/03/2014  . 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 Static tremor 04/17/2012    Past Surgical History:  Procedure Laterality Date  . BREAST SURGERY  2006   cystectomy  . TYMPANOPLASTY     left x 2    OB History    Gravida Para Term Preterm AB Living   0             SAB TAB Ectopic Multiple Live Births                   Home Medications    Prior to Admission medications   Medication Sig Start Date End  Date Taking? Authorizing Provider  albuterol (PROVENTIL HFA;VENTOLIN HFA) 108 (90 Base) MCG/ACT inhaler Inhale 1-2 puffs into the lungs every 4 (four) hours as needed. 02/09/16   Ofilia Neas, PA-C  beclomethasone (QVAR) 40 MCG/ACT inhaler Inhale 4 puffs into the lungs 2 (two) times daily. 04/27/16   Ofilia Neas, PA-C  dicyclomine (BENTYL) 10 MG capsule Take 1 capsule (10 mg total) by mouth 4 (four) times daily -  before meals and at bedtime. 10/02/16   Weber, Dema Severin, PA-C  fluticasone (FLONASE) 50 MCG/ACT nasal spray Place 2 sprays into both nostrils daily. 02/09/16   Ofilia Neas, PA-C  ibuprofen (ADVIL,MOTRIN) 800 MG tablet Take 1 tablet (800 mg total) by mouth every 8 (eight) hours as needed. For menstrual pain 08/23/16   Fontaine, Nadyne Coombes, MD  levocetirizine (XYZAL) 5 MG tablet Take 1 tablet (5 mg total) by mouth every evening. 02/09/16   Ofilia Neas, PA-C  omeprazole (PRILOSEC) 20 MG capsule Take 20 mg by mouth every other day.     [provider]  ondansetron (ZOFRAN) 4 MG tablet Take 1 tablet (4 mg total) by mouth every 8 (eight) hours as needed for nausea or vomiting. 09/27/16   York Spaniel, MD  Peak Flow  Meter DEVI Once well establish a baseline. 02/09/16   Ofilia Neaslark, Michael L, PA-C  polyethylene glycol powder (GLYCOLAX/MIRALAX) powder Take 17 g by mouth 2 (two) times daily as needed. 10/02/16   Weber, Dema SeverinSarah L, PA-C  propranolol ER (INDERAL LA) 60 MG 24 hr capsule Take 60 mg by mouth daily. 01/27/15   [provider]  topiramate (TOPAMAX) 50 MG tablet TAKE 1 TABLET BY MOUTH EVERY MORNING AND 2 TABLETS EVERY EVENING 10/18/16   York SpanielWillis, Charles K, MD  triamcinolone cream (KENALOG) 0.1 % APPLY EXTERNALLY TO THE AFFECTED AREA TWICE DAILY 07/26/16   Shade FloodGreene, Jeffrey R, MD    Family History Family History  Problem Relation Age of Onset  . Hypertension Mother   . Migraines Mother   . AAA (abdominal aortic aneurysm) Father   . Migraines Sister   . Myasthenia  gravis Sister     Social History Social History  Substance Use Topics  . Smoking status: Never Smoker  . Smokeless tobacco: Never Used  . Alcohol use 0.0 oz/week     Comment: Wine or mixed drink ocassionally     Allergies   Lactose and Diamox [acetazolamide]   Review of Systems Review of Systems  All other systems reviewed and are negative.    Physical Exam Updated Vital Signs BP 125/80   Pulse 99   Temp 98.7 F (37.1 C) (Oral)   Resp 18   Ht 5\' 7"  (1.702 m)   Wt 108.9 kg (240 lb)   SpO2 100%   BMI 37.59 kg/m   Physical Exam  Constitutional: She is oriented to person, place, and time. She appears well-developed.  Slightly uncomfortable   HENT:  Head: Normocephalic.  Mouth/Throat: Oropharynx is clear and moist.  Eyes: Pupils are equal, round, and reactive to light. Conjunctivae and EOM are normal.  Neck: Normal range of motion. Neck supple.  Cardiovascular: Normal rate, regular rhythm and normal heart sounds.   Pulmonary/Chest: Effort normal and breath sounds normal. No respiratory distress. She has no wheezes.  Abdominal: Soft.  Mild epigastric tenderness, no RUQ tenderness, no obvious CVAT   Musculoskeletal: Normal range of motion.  Neurological: She is alert and oriented to person, place, and time. No cranial nerve deficit. Coordination normal.  Skin: Skin is warm.  Psychiatric: She has a normal mood and affect.  Nursing note and vitals reviewed.    ED Treatments / Results  Labs (all labs ordered are listed, but only abnormal results are displayed) Labs Reviewed  CBC  URINALYSIS, ROUTINE W REFLEX MICROSCOPIC  LIPASE, BLOOD  COMPREHENSIVE METABOLIC PANEL  I-STAT BETA HCG BLOOD, ED ([facility], [facility], [facility] ONLY)    EKG  EKG Interpretation None       Radiology No results found.  Procedures Procedures (including critical care time)  Medications Ordered in ED Medications  famotidine (PEPCID) IVPB 20 mg premix (not administered)  sodium chloride 0.9 %  bolus 1,000 mL (1,000 mLs Intravenous New Bag/Given 11/07/16 0944)  ondansetron (ZOFRAN) injection 4 mg (4 mg Intravenous Given 11/07/16 0944)     Initial Impression / Assessment and Plan / ED Course  I have reviewed the triage vital signs and the nursing notes.  Pertinent labs & imaging results that were available during my care of the patient were reviewed by me and considered in my medical decision making (see chart for details).     Michele Douglas is a [age] y.o. female here with epigastric pain, diarrhea, fever. Likely gastro. Consider biliary vs renal colic as well.  Will get labs, lipase, UA, US abdomen.   12:30 PM WBC nl. Bicarb 17 likely from diarrhea. No vomiting in the ED. LFTs nl, lipase nl. US showed no gallstones or kidney stones. Likely mild gastro. Will dc home with zofran, bentyl for cramps, imodium prn.   Final Clinical Impressions(s) / ED Diagnoses   Final diagnoses:  None    New Prescriptions New Prescriptions   No medications on file     Charlynne Pander, MD 11/07/16 1230

## 2016-11-07 NOTE — ED Triage Notes (Signed)
Patient complains of generalized abdominal and back pain since last pm with nausea, chills, fever and diarrhea. Denies emesis and dysuria. Alert and oriented, took tylenol 3am and zofran last night

## 2016-12-24 ENCOUNTER — Telehealth: Payer: Self-pay | Admitting: Family Medicine

## 2016-12-24 MED ORDER — BECLOMETHASONE DIPROP HFA 40 MCG/ACT IN AERB: 1.0000 | INHALATION_SPRAY | Freq: Two times a day (BID) | RESPIRATORY_TRACT | 3 refills | Status: DC

## 2016-12-24 NOTE — Telephone Encounter (Signed)
Refill needed for Qvar. Discussed with patient - taking 1 puff once per day. Has used albuterol before every workout, and with weather changes. Plan on refill of Qvar redihaler - 6540mcg/inh, but to use BID every day. Continue albuterol if needed for breakthrough sx's and recheck in 3 months to determine asthma control.

## 2017-01-26 ENCOUNTER — Encounter: Payer: Self-pay | Admitting: Family Medicine

## 2017-01-26 ENCOUNTER — Ambulatory Visit (INDEPENDENT_AMBULATORY_CARE_PROVIDER_SITE_OTHER): Payer: BLUE CROSS/BLUE SHIELD | Admitting: Family Medicine

## 2017-01-26 ENCOUNTER — Other Ambulatory Visit: Payer: Self-pay

## 2017-01-26 VITALS — BP 120/74 | HR 80 | Temp 98.4°F | Resp 18 | Ht 67.0 in | Wt 240.0 lb

## 2017-01-26 DIAGNOSIS — Z23 Encounter for immunization: Secondary | ICD-10-CM

## 2017-01-26 DIAGNOSIS — M79645 Pain in left finger(s): Secondary | ICD-10-CM

## 2017-01-26 DIAGNOSIS — M79644 Pain in right finger(s): Secondary | ICD-10-CM

## 2017-01-26 DIAGNOSIS — M25532 Pain in left wrist: Secondary | ICD-10-CM | POA: Diagnosis not present

## 2017-01-26 DIAGNOSIS — M25531 Pain in right wrist: Secondary | ICD-10-CM | POA: Diagnosis not present

## 2017-01-26 MED ORDER — IBUPROFEN 800 MG PO TABS: 800.0000 mg | ORAL_TABLET | Freq: Three times a day (TID) | ORAL | 1 refills | Status: DC | PRN

## 2017-01-26 NOTE — Patient Instructions (Addendum)
IF you received an x-ray today, you will receive an invoice from [facility]. Please contact [facility] at [phone] with questions or concerns regarding your invoice.   IF you received labwork today, you will receive an invoice from [facility]. Please contact [facility] at [phone] with questions or concerns regarding your invoice.   Our billing staff will not be able to assist you with questions regarding bills from these companies.  You will be contacted with the lab results as soon as they are available. The fastest way to get your results is to activate your My Chart account. Instructions are located on the last page of this paperwork. If you have not heard from [place] regarding the results in 2 weeks, please contact this office.     Repetitive Strain Injuries Repetitive strain injuries (RSIs) result from the overuse or misuse of muscles, nerves, or cords of tissue that attach muscles to bones (tendons). RSIs can affect almost any part of the body. However, RSIs are most common in:  The arms, including the thumbs, wrists, elbows, and shoulders.  The legs, including the ankles and knees.  Repetitive strain often causes certain common medical conditions, such as carpal tunnel syndrome, tennis elbow, and golfer's elbow. The severity and duration of symptoms caused by an RSI can usually be reduced by treating the condition early and by stopping or modifying activities that cause symptoms. What are the causes? RSIs are caused by repeating the same activity for long periods of time without enough rest. Repetitive strain often results from activities done:  At work, such as typing for long periods of time.  During a hobby or sport.  What increases the risk? Certain workplace and personal factors may make you more likely to develop an RSI. Workplace Risk Factors  Frequent computer use.  Infrequent rest breaks.  Repetitive motions.  Working in an awkward  position or holding the same position for a long time.  Forceful movements, such as lifting, pulling, or pushing.  Vibration caused by using power tools.  Working in cold temperatures. Personal Risk Factors  Poor posture.  Not exercising regularly.  Being overweight.  Arthritis, diabetes, thyroid problems, or other long-term (chronic) medical conditions.  Vitamin deficiencies. What are the signs or symptoms? Symptoms often begin while you are using your affected body part and become more noticeable after the repeated stress has ended. Common symptoms include:  Burning, shooting, or aching pain.  Tenderness.  Swelling.  Tingling or numbness.  Fatigue.  Weakness, heaviness, or loss of coordination in the affected body area.  Muscle stiffness.  Sudden, involuntary muscle tightening (spasms).  In some cases, symptoms can become severe enough to cause difficulty with everyday tasks. How is this diagnosed? An RSI may be diagnosed with a physical exam and an evaluation of your everyday activities. Your health care provider may ask questions about your job and your physical activity level. You may have tests, including:  X-rays.  Electromyogram (EMG). This test measures electrical signals that your nerves send to your muscles.  How is this treated? Treatment depends on the severity and type of RSI you have. Treatment may include:  Resting the affected body part. This may include modifying or stopping certain activities that cause symptoms.  Medicines to help relieve pain.  Wearing a splint on the affected body part.  Wearing a wrap that applies pressure (compression wrap) to the affected body part.  Physical therapy.  Occupational therapy.  Surgery. This may be done for severe  RSIs.  RSIs may take weeks or months to heal, depending on your condition. Follow these instructions at home: Managing pain, stiffness, and swelling  Wear compression wraps only as told  by your health care provider.  If directed, put ice on the injured area: ? Put ice in a plastic bag. ? Place a towel between your skin and the bag. ? Leave the ice on for 20 minutes, 2-3 times a day.  If directed, apply heat to the affected area as often as told by your health care provider. Use the heat source that your health care provider recommends, such as a moist heat pack or a heating pad. ? Place a towel between your skin and the heat source. ? Leave the heat on for 20-30 minutes. ? Remove the heat if your skin turns bright red. This is especially important if you are unable to feel pain, heat, or cold. You may have a greater risk of getting burned.  If your arm or leg is affected, move your fingers or toes often to avoid stiffness and to lessen swelling.  If possible, raise (elevate) the injured area above the level of your heart while you are sitting or lying down.  Rest your affected body part as told by your health care provider. If you have a splint:  Wear the splint as told by your health care provider. Remove it only as told by your health care provider.  Loosen the splint if your fingers or toes tingle, become numb, or turn cold and blue.  Keep the splint clean and dry. Activity  Return to your normal activities as told by your health care provider. Ask your health care provider what activities are safe for you.  Stop or modify activities that cause your symptoms or make them worse, as told by your health care provider.  Do exercises as instructed. General instructions  Take over-the-counter and prescription medicines only as told by your health care provider.  Do not drive or operate heavy machinery while taking prescription pain medicine.  If your condition is work-related, talk to your employer about changes that can be made.  Keep all follow-up visits as told by your health care provider. This is important. How is this prevented?  Maintain good posture.  Good posture means that: ? Your spine is in its natural S-curve position (neutral position). ? Your feet are flat on the floor. ? Your knees are directly over your feet, bent at a right angle. ? Your arms are relaxed and at your sides. Your neck is relaxed and not bent forward or backward.  If possible, adjust your work station so that you can maintain good posture. ? Sit in a chair that supports the natural curve of your lower back. ? Slide your chair under your desk so that you are close enough to reach your keyboard and computer mouse with your elbows at your side, bent at a right angle, and your forearms parallel to the ground. ? Have your computer monitor directly in front of you so that your eyes are level with the top of the screen. The screen should be about 15-25 inches (38.1-63.5 cm) from your eyes.  Take breaks often from any repeated activity. Alternate with another task that requires you to use different muscles.  Change positions regularly. If you spend a lot of time sitting, get up and move around frequently.  Exercise regularly, as told by your health care provider.  Maintain a healthy weight. Contact a health care  provider if:  You develop new symptoms.  Your symptoms get worse or do not improve with medicine. This information is not intended to replace advice given to you by your health care provider. Make sure you discuss any questions you have with your health care provider. Document Released: 02/05/2002 Document Revised: 03/19/2015 Document Reviewed: 01/06/2015 Elsevier Interactive Patient Education  [employer]2018 [employer]

## 2017-01-26 NOTE — Progress Notes (Signed)
Chief Complaint  Patient presents with  . Hand Pain    both hands x 2 weeks, ibuprofen, tylenol and heat/ice for pain, has gotten better over the past 2 weeks but unsure if time is the reason or the medication    HPI Pt went kickboxing on the 01/12/17 She reports that she had handpain after kickboxing but could not drive or even form a fist She was wearing cloves She reports that since 01/13/2017 She states that her pain is tolerable during the day but worse at night She reports that her pain hurts with grasping motions She states that she has noticed that it has gotten better with ibuprofen, tylenol, heat and ice.  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 Asthma   . Essential tremor   . GERD (gastroesophageal reflux disease)   . Lactose intolerance   . 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 Pseudotumor cerebri   . Tremor     Current Outpatient Medications  Medication Sig Dispense Refill  . albuterol (PROVENTIL HFA;VENTOLIN HFA) 108 (90 Base) MCG/ACT inhaler Inhale 1-2 puffs into the lungs every 4 (four) hours as needed. 1 Inhaler 1  . beclomethasone (QVAR REDIHALER) 40 MCG/ACT inhaler Inhale 1 puff into the lungs 2 (two) times daily. 10.6 g 3  . fluticasone (FLONASE) 50 MCG/ACT nasal spray Place 2 sprays into both nostrils daily. 16 g 6  . ibuprofen (ADVIL,MOTRIN) 800 MG tablet Take 1 tablet (800 mg total) by mouth every 8 (eight) hours as needed. For menstrual pain 60 tablet 1  . levocetirizine (XYZAL) 5 MG tablet Take 1 tablet (5 mg total) by mouth every evening. 30 tablet 11  . ondansetron (ZOFRAN ODT) 4 MG disintegrating tablet 4mg  ODT q6 hours prn nausea/vomit 10 tablet 0  . propranolol ER (INDERAL LA) 60 MG 24 hr capsule Take 60 mg by mouth daily.  2  . topiramate (TOPAMAX) 50 MG tablet TAKE 1 TABLET BY MOUTH  EVERY MORNING AND 2 TABLETS EVERY EVENING 90 tablet 6  . triamcinolone cream (KENALOG) 0.1 % APPLY EXTERNALLY TO THE AFFECTED AREA TWICE DAILY 30 g 0  . beclomethasone (QVAR) 40 MCG/ACT inhaler Inhale 4 puffs into the lungs 2 (two) times daily. (Patient not taking: Reported on 01/26/2017) 1 Inhaler 12  . dicyclomine (BENTYL) 10 MG capsule Take 1 capsule (10 mg total) by mouth 3 (three) times daily as needed for spasms. (Patient not taking: Reported on 01/26/2017) 15 capsule 0  . omeprazole (PRILOSEC) 20 MG capsule Take 20 mg by mouth every other day.     . Peak Flow Meter DEVI Once well establish a baseline. 1 each 0   No current facility-administered medications for this visit.     Allergies:  Allergies  Allergen Reactions  . Lactose Other (See Comments)    Nausea, vomiting, diarrhea.   . Diamox [Acetazolamide] Other (See Comments)    Tingling in fingers and lips    Past Surgical History:  Procedure Laterality Date  . BREAST SURGERY  2006   cystectomy  . TYMPANOPLASTY     left x 2    Social History   Socioeconomic History  . Marital status: Single    Spouse name: None  . Number of children: 0  . Years of education: college  . Highest education level: None  Social Needs  . Financial  resource strain: None  . Food insecurity - worry: None  . Food insecurity - inability: None  . Transportation needs - medical: None  . Transportation needs - non-medical: None  Occupational History  . None  Tobacco Use  . Smoking status: Never Smoker  . Smokeless tobacco: Never Used  Substance and Sexual Activity  . Alcohol use: Yes    Alcohol/week: 0.0 oz    Comment: Wine or mixed drink ocassionally  . Drug use: No  . Sexual activity: Not Currently    Comment: 1st intercourse 26 yo-Fewer than 5 partners  Other Topics Concern  . None  Social History Narrative   Patient drinks caffeine occasionally.   Patient is right handed.     Family History  Problem Relation Age of Onset  .  Hypertension Mother   . Migraines Mother   . AAA (abdominal aortic aneurysm) Father   . Migraines Sister   . Myasthenia gravis Sister      ROS Review of Systems See HPI Constitution: No fevers or chills No malaise No diaphoresis Skin: No rash or itching Eyes: no blurry vision, no double vision GU: no dysuria or hematuria Neuro: no dizziness or headaches all others reviewed and negative   Objective: Vitals:   01/26/17 1507  BP: 120/74  Pulse: 80  Resp: 18  Temp: 98.4 F (36.9 C)  TempSrc: Oral  SpO2: 100%  Weight: 240 lb (108.9 kg)  Height: 5\' 7"  (1.702 m)    Physical Exam  Constitutional: She is oriented to person, place, and time. She appears well-developed and well-nourished.  HENT:  Head: Normocephalic and atraumatic.  Eyes: Conjunctivae and EOM are normal.  Cardiovascular: Normal rate, regular rhythm and normal heart sounds.  No murmur heard. Pulmonary/Chest: Effort normal and breath sounds normal. No stridor. No respiratory distress.  Musculoskeletal:       Right hand: She exhibits tenderness and bony tenderness. She exhibits normal range of motion, normal two-point discrimination, normal capillary refill, no deformity, no laceration and no swelling. Normal strength noted.       Hands: Neurological: She is alert and oriented to person, place, and time.  Psychiatric: She has a normal mood and affect. Her behavior is normal. Judgment and thought content normal.   Allen test normal Phalen normal tinels normal  Assessment and Plan Michele Douglas was seen today for hand pain.  Diagnoses and all orders for this visit:  Bilateral wrist pain Bilateral thumb pain -     Ambulatory referral to Physical Therapy  Need for Tdap vaccination -     Tdap vaccine greater than or equal to 7yo IM      Michele Douglas

## 2017-02-10 ENCOUNTER — Other Ambulatory Visit: Payer: Self-pay | Admitting: Physician Assistant

## 2017-02-10 DIAGNOSIS — J302 Other seasonal allergic rhinitis: Secondary | ICD-10-CM

## 2017-02-24 ENCOUNTER — Ambulatory Visit (INDEPENDENT_AMBULATORY_CARE_PROVIDER_SITE_OTHER): Payer: BLUE CROSS/BLUE SHIELD | Admitting: Family Medicine

## 2017-02-24 ENCOUNTER — Encounter: Payer: Self-pay | Admitting: Family Medicine

## 2017-02-24 ENCOUNTER — Other Ambulatory Visit: Payer: Self-pay

## 2017-02-24 VITALS — BP 122/88 | HR 81 | Temp 98.9°F | Resp 18 | Ht 67.0 in | Wt 251.4 lb

## 2017-02-24 DIAGNOSIS — J4541 Moderate persistent asthma with (acute) exacerbation: Secondary | ICD-10-CM | POA: Diagnosis not present

## 2017-02-24 DIAGNOSIS — J069 Acute upper respiratory infection, unspecified: Secondary | ICD-10-CM | POA: Diagnosis not present

## 2017-02-24 MED ORDER — PREDNISONE 20 MG PO TABS: 40.0000 mg | ORAL_TABLET | Freq: Every day | ORAL | 0 refills | Status: AC

## 2017-02-24 MED ORDER — AZITHROMYCIN 250 MG PO TABS: ORAL_TABLET | ORAL | 0 refills | Status: DC

## 2017-02-24 MED ORDER — PEAK FLOW METER DEVI: 0 refills | Status: AC

## 2017-02-24 NOTE — Patient Instructions (Addendum)
   IF you received an x-ray today, you will receive an invoice from [facility]. Please contact [facility] at [phone] with questions or concerns regarding your invoice.   IF you received labwork today, you will receive an invoice from [facility]. Please contact [facility] at [phone] with questions or concerns regarding your invoice.   Our billing staff will not be able to assist you with questions regarding bills from these companies.  You will be contacted with the lab results as soon as they are available. The fastest way to get your results is to activate your My Chart account. Instructions are located on the last page of this paperwork. If you have not heard from us regarding the results in 2 weeks, please contact this office.      Asthma, Acute Bronchospasm Acute bronchospasm caused by asthma is also referred to as an asthma attack. Bronchospasm means your air passages become narrowed. The narrowing is caused by inflammation and tightening of the muscles in the air tubes (bronchi) in your lungs. This can make it hard to breathe or cause you to wheeze and cough. What are the causes? Possible triggers are:  Animal dander from the skin, hair, or feathers of animals.  Dust mites contained in house dust.  Cockroaches.  Pollen from trees or grass.  Mold.  Cigarette or tobacco smoke.  Air pollutants such as dust, household cleaners, hair sprays, aerosol sprays, paint fumes, strong chemicals, or strong odors.  Cold air or weather changes. Cold air may trigger inflammation. Winds increase molds and pollens in the air.  Strong emotions such as crying or laughing hard.  Stress.  Certain medicines such as aspirin or beta-blockers.  Sulfites in foods and drinks, such as dried fruits and wine.  Infections or inflammatory conditions, such as a flu, cold, or inflammation of the nasal membranes (rhinitis).  Gastroesophageal reflux disease (GERD). GERD is a  condition where stomach acid backs up into your esophagus.  Exercise or strenuous activity. What are the signs or symptoms?  Wheezing.  Excessive coughing, particularly at night.  Chest tightness.  Shortness of breath. How is this diagnosed? Your health care provider will ask you about your medical history and perform a physical exam. A chest X-ray or blood testing may be performed to look for other causes of your symptoms or other conditions that may have triggered your asthma attack. How is this treated? Treatment is aimed at reducing inflammation and opening up the airways in your lungs. Most asthma attacks are treated with inhaled medicines. These include quick relief or rescue medicines (such as bronchodilators) and controller medicines (such as inhaled corticosteroids). These medicines are sometimes given through an inhaler or a nebulizer. Systemic steroid medicine taken by mouth or given through an IV tube also can be used to reduce the inflammation when an attack is moderate or severe. Antibiotic medicines are only used if a bacterial infection is present. Follow these instructions at home:  Rest.  Drink plenty of liquids. This helps the mucus to remain thin and be easily coughed up. Only use caffeine in moderation and do not use alcohol until you have recovered from your illness.  Do not smoke. Avoid being exposed to secondhand smoke.  You play a critical role in keeping yourself in good health. Avoid exposure to things that cause you to wheeze or to have breathing problems.  Keep your medicines up-to-date and available. Carefully follow your health care provider's treatment plan.  Take your medicine exactly as prescribed.    When pollen or pollution is bad, keep windows closed and use an air conditioner or go to places with air conditioning.  Asthma requires careful medical care. See your health care provider for a follow-up as advised. If you are more than [redacted] weeks pregnant  and you were prescribed any new medicines, let your obstetrician know about the visit and how you are doing. Follow up with your health care provider as directed.  After you have recovered from your asthma attack, make an appointment with your outpatient doctor to talk about ways to reduce the likelihood of future attacks. If you do not have a doctor who manages your asthma, make an appointment with a primary care doctor to discuss your asthma. Get help right away if:  You are getting worse.  You have trouble breathing. If severe, call your local emergency services (911 in the [place]  You develop chest pain or discomfort.  You are vomiting.  You are not able to keep fluids down.  You are coughing up yellow, green, brown, or bloody sputum.  You have a fever and your symptoms suddenly get worse.  You have trouble swallowing. This information is not intended to replace advice given to you by your health care provider. Make sure you discuss any questions you have with your health care provider. Document Released: 06/02/2006 Document Revised: 07/30/2015 Document Reviewed: 08/23/2012 Elsevier Interactive Patient Education  2017 [employer]vier Inc.  

## 2017-02-24 NOTE — Progress Notes (Signed)
Chief Complaint  Patient presents with  . Illness    x1 week, pt states she is having wheezing and rattling in her chest. Pt states she has been doing at home nebs and using her inhaler and it is giving her temporary relief.    HPI  She reports one week of coughing and hoarseness At night she has been wheezing She denies fevers but reports chills She is having thick green mucus from the nose and also when she coughs No posttussive emesis No nausea or diarrhea She denies sinus pressure, no tinnitus or dizziness    Past Medical History:  Diagnosis Date  . Asthma   . Essential tremor   . GERD (gastroesophageal reflux disease)   . Lactose intolerance   . 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 Pseudotumor cerebri   . Tremor     Current Outpatient Medications  Medication Sig Dispense Refill  . albuterol (PROVENTIL HFA;VENTOLIN HFA) 108 (90 Base) MCG/ACT inhaler Inhale 1-2 puffs into the lungs every 4 (four) hours as needed. 1 Inhaler 1  . beclomethasone (QVAR REDIHALER) 40 MCG/ACT inhaler Inhale 1 puff into the lungs 2 (two) times daily. 10.6 g 3  . beclomethasone (QVAR) 40 MCG/ACT inhaler Inhale 4 puffs into the lungs 2 (two) times daily. 1 Inhaler 12  . fluticasone (FLONASE) 50 MCG/ACT nasal spray SHAKE LIQUID AND USE 2 SPRAYS IN EACH NOSTRIL DAILY 16 g 0  . ibuprofen (ADVIL,MOTRIN) 800 MG tablet Take 1 tablet (800 mg total) by mouth every 8 (eight) hours as needed. For menstrual pain 60 tablet 1  . levocetirizine (XYZAL) 5 MG tablet TAKE 1 TABLET(5 MG) BY MOUTH EVERY EVENING 30 tablet 0  . ondansetron (ZOFRAN ODT) 4 MG disintegrating tablet 4mg  ODT q6 hours prn nausea/vomit 10 tablet 0  . propranolol ER (INDERAL LA) 60 MG 24 hr capsule Take 60 mg by mouth daily.  2  . topiramate (TOPAMAX) 50 MG tablet TAKE 1 TABLET BY MOUTH EVERY MORNING AND 2 TABLETS EVERY EVENING 90 tablet 6  . triamcinolone cream (KENALOG)  0.1 % APPLY EXTERNALLY TO THE AFFECTED AREA TWICE DAILY 30 g 0  . azithromycin (ZITHROMAX) 250 MG tablet Take 2 tablets on day 1 then one tablet on the remaining days 6 tablet 0  . dicyclomine (BENTYL) 10 MG capsule Take 1 capsule (10 mg total) by mouth 3 (three) times daily as needed for spasms. (Patient not taking: Reported on 01/26/2017) 15 capsule 0  . omeprazole (PRILOSEC) 20 MG capsule Take 20 mg by mouth every other day.     . Peak Flow Meter (PEAK AIR PEAK FLOW METER) DEVI Use peak flow as instructed for asthma J45.41 1 each 0  . Peak Flow Meter DEVI Once well establish a baseline. (Patient not taking: Reported on 02/24/2017) 1 each 0  . predniSONE (DELTASONE) 20 MG tablet Take 2 tablets (40 mg total) by mouth daily with breakfast for 5 days. 10 tablet 0   No current facility-administered medications for this visit.     Allergies:  Allergies  Allergen Reactions  . Lactose Other (See Comments)    Nausea, vomiting, diarrhea.   . Diamox [Acetazolamide] Other (See Comments)    Tingling in fingers and lips    Past Surgical History:  Procedure Laterality Date  . BREAST SURGERY  2006   cystectomy  . TYMPANOPLASTY     left x 2    Social History   Socioeconomic  History  . Marital status: Single    Spouse name: None  . Number of children: 0  . Years of education: college  . Highest education level: None  Social Needs  . Financial resource strain: None  . Food insecurity - worry: None  . Food insecurity - inability: None  . Transportation needs - medical: None  . Transportation needs - non-medical: None  Occupational History  . None  Tobacco Use  . Smoking status: Never Smoker  . Smokeless tobacco: Never Used  Substance and Sexual Activity  . Alcohol use: Yes    Alcohol/week: 0.0 oz    Comment: Wine or mixed drink ocassionally  . Drug use: No  . Sexual activity: Not Currently    Comment: 1st intercourse 26 yo-Fewer than 5 partners  Other Topics Concern  . None    Social History Narrative   Patient drinks caffeine occasionally.   Patient is right handed.     Family History  Problem Relation Age of Onset  . Hypertension Mother   . Migraines Mother   . AAA (abdominal aortic aneurysm) Father   . Migraines Sister   . Myasthenia gravis Sister      ROS Review of Systems See HPI Constitution: No fevers or chills No malaise No diaphoresis Skin: No rash or itching Eyes: no blurry vision, no double vision GU: no dysuria or hematuria Neuro: no dizziness or headaches all others reviewed and negative   Objective: Vitals:   02/24/17 1404  BP: 122/88  Pulse: 81  Resp: 18  Temp: 98.9 F (37.2 C)  TempSrc: Oral  SpO2: 100%  Weight: 251 lb 6.4 oz (114 kg)  Height: 5\' 7"  (1.702 m)    Physical Exam General: alert, oriented, in NAD Head: normocephalic, atraumatic, no sinus tenderness Eyes: EOM intact, no scleral icterus or conjunctival injection Ears: TM clear bilaterally Nose: mucosa nonerythematous, + edematous Throat: no pharyngeal exudate or erythema Lymph: no posterior auricular, submental or cervical lymph adenopathy Heart: normal rate, normal sinus rhythm, no murmurs Lungs: clear to auscultation bilaterally, no wheezing    Assessment and Plan Michele Douglas was seen today for illness.  Diagnoses and all orders for this visit:  Acute URI  Moderate persistent asthma with acute exacerbation -     Peak Flow Meter (PEAK AIR PEAK FLOW METER) DEVI; Use peak flow as instructed for asthma J45.41  Other orders -     predniSONE (DELTASONE) 20 MG tablet; Take 2 tablets (40 mg total) by mouth daily with breakfast for 5 days. -     azithromycin (ZITHROMAX) 250 MG tablet; Take 2 tablets on day 1 then one tablet on the remaining days   Continue qvar with albuterol q4-6 hrs Will add on zpak and steroid burst for acute asthma exacerbation  Pt not scheduled to work until January so advised rest, hydration and continuing her asthma  medications Follow up in 2 weeks if no improvement    Michele Douglas

## 2017-03-08 ENCOUNTER — Encounter: Payer: Self-pay | Admitting: Family Medicine

## 2017-03-10 ENCOUNTER — Other Ambulatory Visit: Payer: Self-pay | Admitting: Physician Assistant

## 2017-03-10 DIAGNOSIS — J302 Other seasonal allergic rhinitis: Secondary | ICD-10-CM

## 2017-03-19 ENCOUNTER — Other Ambulatory Visit: Payer: Self-pay

## 2017-03-19 ENCOUNTER — Encounter: Payer: Self-pay | Admitting: Family Medicine

## 2017-03-19 ENCOUNTER — Ambulatory Visit: Payer: BLUE CROSS/BLUE SHIELD | Admitting: Family Medicine

## 2017-03-19 VITALS — BP 120/78 | HR 82 | Temp 98.2°F | Resp 17 | Ht 67.0 in | Wt 255.4 lb

## 2017-03-19 DIAGNOSIS — L209 Atopic dermatitis, unspecified: Secondary | ICD-10-CM

## 2017-03-19 DIAGNOSIS — R0981 Nasal congestion: Secondary | ICD-10-CM | POA: Diagnosis not present

## 2017-03-19 DIAGNOSIS — L309 Dermatitis, unspecified: Secondary | ICD-10-CM | POA: Diagnosis not present

## 2017-03-19 DIAGNOSIS — J4541 Moderate persistent asthma with (acute) exacerbation: Secondary | ICD-10-CM

## 2017-03-19 MED ORDER — MONTELUKAST SODIUM 10 MG PO TABS: 10.0000 mg | ORAL_TABLET | Freq: Every day | ORAL | 3 refills | Status: DC

## 2017-03-19 MED ORDER — TRIAMCINOLONE ACETONIDE 0.1 % EX CREA: TOPICAL_CREAM | CUTANEOUS | 0 refills | Status: DC

## 2017-03-19 NOTE — Patient Instructions (Addendum)
Dextromorphan and ephedra and phenylephrine containing products can raise your pressure so check the cold medicine labels for these medications  Try afrin nasal spray otc for 2 days to reduce inflammation in the nose and help with flushing out more of the sinus congestion   IF you received an x-ray today, you will receive an invoice from [facility]. Please contact [facility] at [phone] with questions or concerns regarding your invoice.   IF you received labwork today, you will receive an invoice from [facility]. Please contact [facility] at [phone] with questions or concerns regarding your invoice.   Our billing staff will not be able to assist you with questions regarding bills from these companies.  You will be contacted with the lab results as soon as they are available. The fastest way to get your results is to activate your My Chart account. Instructions are located on the last page of this paperwork. If you have not heard from [place] regarding the results in 2 weeks, please contact this office.     Atopic Dermatitis Atopic dermatitis is a skin disorder that causes inflammation of the skin. This is the most common type of eczema. Eczema is a group of skin conditions that cause the skin to be itchy, red, and swollen. This condition is generally worse during the cooler winter months and often improves during the warm summer months. Symptoms can vary from person to person. Atopic dermatitis usually starts showing signs in infancy and can last through adulthood. This condition cannot be passed from one person to another (non-contagious), but it is more common in families. Atopic dermatitis may not always be present. When it is present, it is called a flare-up. What are the causes? The exact cause of this condition is not known. Flare-ups of the condition may be triggered by:  Contact with something that you are sensitive or allergic to.  Stress.  Certain  foods.  Extremely hot or cold weather.  Harsh chemicals and soaps.  Dry air.  Chlorine.  What increases the risk? This condition is more likely to develop in people who have a personal history or family history of eczema, allergies, asthma, or hay fever. What are the signs or symptoms? Symptoms of this condition include:  Dry, scaly skin.  Red, itchy rash.  Itchiness, which can be severe. This may occur before the skin rash. This can make sleeping difficult.  Skin thickening and cracking that can occur over time.  How is this diagnosed? This condition is diagnosed based on your symptoms, a medical history, and a physical exam. How is this treated? There is no cure for this condition, but symptoms can usually be controlled. Treatment focuses on:  Controlling the itchiness and scratching. You may be given medicines, such as antihistamines or steroid creams.  Limiting exposure to things that you are sensitive or allergic to (allergens).  Recognizing situations that cause stress and developing a plan to manage stress.  If your atopic dermatitis does not get better with medicines, or if it is all over your body (widespread), a treatment using a specific type of light (phototherapy) may be used. Follow these instructions at home: Skin care  Keep your skin well-moisturized. Doing this seals in moisture and helps to prevent dryness. ? Use unscented lotions that have petroleum in them. ? Avoid lotions that contain alcohol or water. They can dry the skin.  Keep baths or showers short (less than 5 minutes) in warm water. Do not use hot water. ? Use mild,  unscented cleansers for bathing. Avoid soap and bubble bath. ? Apply a moisturizer to your skin right after a bath or shower.  Do not apply anything to your skin without checking with your health care provider. General instructions  Dress in clothes made of cotton or cotton blends. Dress lightly because heat increases  itchiness.  When washing your clothes, rinse your clothes twice so all of the soap is removed.  Avoid any triggers that can cause a flare-up.  Try to manage your stress.  Keep your fingernails cut short.  Avoid scratching. Scratching makes the rash and itchiness worse. It may also result in a skin infection (impetigo) due to a break in the skin caused by scratching.  Take or apply over-the-counter and prescription medicines only as told by your health care provider.  Keep all follow-up visits as told by your health care provider. This is important.  Do not be around people who have cold sores or fever blisters. If you get the infection, it may cause your atopic dermatitis to worsen. Contact a health care provider if:  Your itchiness interferes with sleep.  Your rash gets worse or it is not better within one week of starting treatment.  You have a fever.  You have a rash flare-up after having contact with someone who has cold sores or fever blisters. Get help right away if:  You develop pus or soft yellow scabs in the rash area. Summary  This condition causes a red rash and itchy, dry, scaly skin.  Treatment focuses on controlling the itchiness and scratching, limiting exposure to things that you are sensitive or allergic to (allergens), recognizing situations that cause stress, and developing a plan to manage stress.  Keep your skin well-moisturized.  Keep baths or showers shorter than 5 minutes and use warm water. Do not use hot water. This information is not intended to replace advice given to you by your health care provider. Make sure you discuss any questions you have with your health care provider. Document Released: 02/13/2000 Document Revised: 03/19/2016 Document Reviewed: 03/19/2016 Elsevier Interactive Patient Education  [employer]2018 [employer]

## 2017-03-19 NOTE — Progress Notes (Signed)
Chief Complaint  Patient presents with  . persistent discolored nasal congestion/rinorrhea, sneezing,     Finished antibiotic on 1st of January, got better (cough) then by the 4 th symptoms back, ? sinus infection, cough lingering(drainage yellow and clear), itchy watery eyes.  Tried flonase and cetirizine with no real relief.  . Rash    both sides of neck, per pt it comes when allergy symptoms occur.    HPI  Allergic rhinitis She states that her cough and wheezing is better after taking her medications Pt reports that she had improvement of symptoms States that she has improvement after her steroid and zpak She reports that she has sneezing, postnasal drip and cough  With ear pain and dental pain  Rash She states that she gets a rash when she gets the allergic symptoms The rash only comes on her neck She took triamcinolone in the past for her  She reports that she took singulair in the past but it did not seem to work She states that while she is in the building she sneezing all the time She looked for signs of food allergies and did not find any relationship   Past Medical History:  Diagnosis Date  . Asthma   . Essential tremor   . GERD (gastroesophageal reflux disease)   . Lactose intolerance   . 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 Pseudotumor cerebri   . Tremor     Current Outpatient Medications  Medication Sig Dispense Refill  . albuterol (PROVENTIL HFA;VENTOLIN HFA) 108 (90 Base) MCG/ACT inhaler Inhale 1-2 puffs into the lungs every 4 (four) hours as needed. 1 Inhaler 1  . beclomethasone (QVAR REDIHALER) 40 MCG/ACT inhaler Inhale 1 puff into the lungs 2 (two) times daily. 10.6 g 3  . beclomethasone (QVAR) 40 MCG/ACT inhaler Inhale 4 puffs into the lungs 2 (two) times daily. 1 Inhaler 12  . fluticasone (FLONASE) 50 MCG/ACT nasal spray SHAKE LIQUID AND USE 2 SPRAYS IN EACH NOSTRIL DAILY 16 g 0  .  ibuprofen (ADVIL,MOTRIN) 800 MG tablet Take 1 tablet (800 mg total) by mouth every 8 (eight) hours as needed. For menstrual pain 60 tablet 1  . levocetirizine (XYZAL) 5 MG tablet TAKE 1 TABLET(5 MG) BY MOUTH EVERY EVENING 30 tablet 0  . ondansetron (ZOFRAN ODT) 4 MG disintegrating tablet 4mg  ODT q6 hours prn nausea/vomit 10 tablet 0  . Peak Flow Meter (PEAK AIR PEAK FLOW METER) DEVI Use peak flow as instructed for asthma J45.41 1 each 0  . Peak Flow Meter DEVI Once well establish a baseline. 1 each 0  . propranolol ER (INDERAL LA) 60 MG 24 hr capsule Take 60 mg by mouth daily.  2  . topiramate (TOPAMAX) 50 MG tablet TAKE 1 TABLET BY MOUTH EVERY MORNING AND 2 TABLETS EVERY EVENING 90 tablet 6  . triamcinolone cream (KENALOG) 0.1 % APPLY EXTERNALLY TO THE AFFECTED AREA TWICE DAILY 30 g 0  . montelukast (SINGULAIR) 10 MG tablet Take 1 tablet (10 mg total) by mouth at bedtime. 30 tablet 3   No current facility-administered medications for this visit.     Allergies:  Allergies  Allergen Reactions  . Lactose Other (See Comments)    Nausea, vomiting, diarrhea.   . Diamox [Acetazolamide] Other (See Comments)    Tingling in fingers and lips    Past Surgical History:  Procedure Laterality Date  . BREAST SURGERY  2006   cystectomy  .  TYMPANOPLASTY     left x 2    Social History   Socioeconomic History  . Marital status: Single    Spouse name: None  . Number of children: 0  . Years of education: college  . Highest education level: None  Social Needs  . Financial resource strain: None  . Food insecurity - worry: None  . Food insecurity - inability: None  . Transportation needs - medical: None  . Transportation needs - non-medical: None  Occupational History  . None  Tobacco Use  . Smoking status: Never Smoker  . Smokeless tobacco: Never Used  Substance and Sexual Activity  . Alcohol use: Yes    Alcohol/week: 0.0 oz    Comment: Wine or mixed drink ocassionally  . Drug use: No    . Sexual activity: Not Currently    Comment: 1st intercourse 27 yo-Fewer than 5 partners  Other Topics Concern  . None  Social History Narrative   Patient drinks caffeine occasionally.   Patient is right handed.     Family History  Problem Relation Age of Onset  . Hypertension Mother   . Migraines Mother   . AAA (abdominal aortic aneurysm) Father   . Migraines Sister   . Myasthenia gravis Sister      ROS Review of Systems See HPI Constitution: No fevers or chills No malaise No diaphoresis Skin: No rash or itching Eyes: no blurry vision, no double vision GU: no dysuria or hematuria Neuro: no dizziness or headaches all others reviewed and negative   Objective: Vitals:   03/19/17 1034  BP: 120/78  Pulse: 82  Resp: 17  Temp: 98.2 F (36.8 C)  TempSrc: Oral  SpO2: 100%  Weight: 255 lb 6.4 oz (115.8 kg)  Height: 5\' 7"  (1.702 m)    Physical Exam General: alert, oriented, in NAD Head: normocephalic, atraumatic, no sinus tenderness Eyes: EOM intact, no scleral icterus or conjunctival injection Ears: TM clear bilaterally Nose: mucosa nonerythematous, nonedematous Throat: no pharyngeal exudate or erythema Lymph: no posterior auricular, submental or cervical lymph adenopathy Heart: normal rate, normal sinus rhythm, no murmurs Lungs: clear to auscultation bilaterally, no wheezing Skin: bilateral patch of papular rash in a circular pattern  Assessment and Plan Michele Douglas was seen today for persistent discolored nasal congestion/rinorrhea, sneezing,  and rash.  Diagnoses and all orders for this visit:  Moderate persistent asthma with acute exacerbation- continue albuterol prn and qvar as well as flonase Added singulair Will refer to Allergy Immunology  Chronic rhinitis- continue antihistamine and flonase Will add montelukast  Atopic dermatitis, mild- likely due to her asthma and allergies Refilled triamcinolone   Skin rash -     triamcinolone cream (KENALOG)  0.1 %; APPLY EXTERNALLY TO THE AFFECTED AREA TWICE DAILY  Other orders -     montelukast (SINGULAIR) 10 MG tablet; Take 1 tablet (10 mg total) by mouth at bedtime.  Sinus congestion  - advised afrin spray for 2 days then resume flonase   Michele Douglas A Creta LevinStallings

## 2017-04-01 ENCOUNTER — Encounter: Payer: Self-pay | Admitting: Neurology

## 2017-04-01 ENCOUNTER — Ambulatory Visit: Payer: BLUE CROSS/BLUE SHIELD | Admitting: Neurology

## 2017-04-01 VITALS — BP 110/78 | HR 74 | Wt 255.0 lb

## 2017-04-01 DIAGNOSIS — G250 Essential tremor: Secondary | ICD-10-CM | POA: Diagnosis not present

## 2017-04-01 DIAGNOSIS — G43009 Migraine without aura, not intractable, without status migrainosus: Secondary | ICD-10-CM | POA: Diagnosis not present

## 2017-04-01 DIAGNOSIS — G932 Benign intracranial hypertension: Secondary | ICD-10-CM | POA: Diagnosis not present

## 2017-04-01 MED ORDER — TOPIRAMATE 50 MG PO TABS: 50.0000 mg | ORAL_TABLET | Freq: Two times a day (BID) | ORAL | 6 refills | Status: DC

## 2017-04-01 NOTE — Progress Notes (Signed)
Reason for visit: Pseudotumor cerebri  Michele Douglas is an 27 y.o. female  History of present illness:  Michele Douglas is a 27 year old right-handed black female with a history of pseudotumor cerebri and headaches.  The patient is also followed by another neurologist for essential tremors which are minimal, she claims that the tremors are only noticeable when she uses her hands for long period of time.  The patient is on propranolol for the tremor, she takes Topamax, she was to be on 50 mg in the morning and 100 mg in the evening but she began to run out of her medication and cut back to taking one 50 mg tablet daily.  She claims that her headaches have all but disappeared.  She is not having any significant issues with her vision.  She overall feels quite well.  She has not been to her optometrist in quite some time.  She returns this office for an evaluation.  Past Medical History:  Diagnosis Date  . Asthma   . Essential tremor   . GERD (gastroesophageal reflux disease)   . Lactose intolerance   . 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 Pseudotumor cerebri   . Tremor     Past Surgical History:  Procedure Laterality Date  . BREAST SURGERY  2006   cystectomy  . TYMPANOPLASTY     left x 2    Family History  Problem Relation Age of Onset  . Hypertension Mother   . Migraines Mother   . AAA (abdominal aortic aneurysm) Father   . Migraines Sister   . Myasthenia gravis Sister     Social history:  reports that  has never smoked. she has never used smokeless tobacco. She reports that she drinks alcohol. She reports that she does not use drugs.    Allergies  Allergen Reactions  . Lactose Other (See Comments)    Nausea, vomiting, diarrhea.   . Diamox [Acetazolamide] Other (See Comments)    Tingling in fingers and lips    Medications:  Prior to Admission medications   Medication Sig Start Date End Date  Taking? Authorizing Provider  albuterol (PROVENTIL HFA;VENTOLIN HFA) 108 (90 Base) MCG/ACT inhaler Inhale 1-2 puffs into the lungs every 4 (four) hours as needed. 02/09/16  Yes Ofilia Neaslark, Michael L, PA-C  beclomethasone (QVAR REDIHALER) 40 MCG/ACT inhaler Inhale 1 puff into the lungs 2 (two) times daily. 12/24/16  Yes Shade FloodGreene, Jeffrey R, MD  beclomethasone (QVAR) 40 MCG/ACT inhaler Inhale 4 puffs into the lungs 2 (two) times daily. 04/27/16  Yes Ofilia Neaslark, Michael L, PA-C  fluticasone (FLONASE) 50 MCG/ACT nasal spray SHAKE LIQUID AND [facility] 02/10/17  Yes Ofilia Neaslark, Michael L, PA-C  ibuprofen (ADVIL,MOTRIN) 800 MG tablet Take 1 tablet (800 mg total) by mouth every 8 (eight) hours as needed. For menstrual pain 01/26/17  Yes Stallings, Zoe A, MD  levocetirizine (XYZAL) 5 MG tablet TAKE 1 TABLET(5 MG) BY MOUTH EVERY EVENING 03/10/17  Yes Stallings, Zoe A, MD  montelukast (SINGULAIR) 10 MG tablet Take 1 tablet (10 mg total) by mouth at bedtime. 03/19/17  Yes Doristine BosworthStallings, Zoe A, MD  ondansetron (ZOFRAN ODT) 4 MG disintegrating tablet 4mg  ODT q6 hours prn nausea/vomit 11/07/16  Yes Charlynne PanderYao, David Hsienta, MD  Peak Flow Meter (PEAK AIR PEAK FLOW METER) DEVI Use peak flow as instructed for asthma J45.41 02/24/17  Yes Doristine BosworthStallings, Zoe A, MD  Peak Flow Meter DEVI  Once well establish a baseline. 02/09/16  Yes Ofilia Neas, PA-C  propranolol ER (INDERAL LA) 60 MG 24 hr capsule Take 60 mg by mouth daily. 01/27/15  Yes [provider]  topiramate (TOPAMAX) 50 MG tablet Take 1 tablet (50 mg total) by mouth 2 (two) times daily. 04/01/17  Yes York Spaniel, MD  triamcinolone cream (KENALOG) 0.1 % APPLY EXTERNALLY TO THE AFFECTED AREA TWICE DAILY 03/19/17  Yes Collie Siad A, MD    ROS:  Out of a complete 14 system review of symptoms, the patient complains only of the following symptoms, and all other reviewed systems are negative.  Headache  Blood pressure 110/78, pulse 74, weight 255 lb (115.7  kg), SpO2 98 %.  Physical Exam  General: The patient is alert and cooperative at the time of the examination.  The patient is moderately obese.  Skin: No significant peripheral edema is noted.   Neurologic Exam  Mental status: The patient is alert and oriented x 3 at the time of the examination. The patient has apparent normal recent and remote memory, with an apparently normal attention span and concentration ability.   Cranial nerves: Facial symmetry is present. Speech is normal, no aphasia or dysarthria is noted. Extraocular movements are full. Visual fields are full.  Pupils are equal, round, and reactive to light.  Discs appear to be flat bilaterally, I could not see definite venous pulsations.  Motor: The patient has good strength in all 4 extremities.  Sensory examination: Soft touch sensation is symmetric on the face, arms, and legs.  Coordination: The patient has good finger-nose-finger and heel-to-shin bilaterally.  Gait and station: The patient has a normal gait. Tandem gait is normal. Romberg is negative. No drift is seen.  Reflexes: Deep tendon reflexes are symmetric.   Assessment/Plan:  1.  History of pseudotumor cerebri  2.  Essential tremor  The patient is not being followed currently for her pseudotumor through her optometrist.  I will make a referral to an ophthalmologist.  Clinically, she seems to be doing quite well.  The patient will follow-up otherwise in about 6 months.  Marlan Palau MD 04/01/2017 9:24 AM  [facility] [address] [place], [place] [address]  Phone [phone] Fax [phone]

## 2017-04-25 ENCOUNTER — Telehealth: Payer: Self-pay | Admitting: Neurology

## 2017-04-25 MED ORDER — MECLIZINE HCL 25 MG PO TABS: 25.0000 mg | ORAL_TABLET | Freq: Three times a day (TID) | ORAL | 1 refills | Status: AC | PRN

## 2017-04-25 NOTE — Telephone Encounter (Signed)
Pt called stating she has been dizzy and nauseas since yesterday. Pt said that she went home to rest but once she got up this morning to go to work she got sick. Pt is wanting to know if there is anything that can be called in to hope, stating that she has taken her Zofran

## 2017-04-25 NOTE — Telephone Encounter (Signed)
I called the patient.  The patient will have episodes of dizziness at times, the last episode was 2 months ago, she was dizzy and vertiginous yesterday and then today, she had vomiting today even after taking Zofran.  This could be part of a migraine syndrome, the patient does not have headache.  I will send in a prescription for meclizine to see if this helps.

## 2017-05-02 ENCOUNTER — Ambulatory Visit: Payer: BLUE CROSS/BLUE SHIELD | Admitting: Allergy

## 2017-05-02 ENCOUNTER — Encounter: Payer: Self-pay | Admitting: Allergy

## 2017-05-02 VITALS — BP 128/72 | HR 76 | Temp 98.7°F | Resp 18 | Ht 67.0 in | Wt 260.0 lb

## 2017-05-02 DIAGNOSIS — Z9109 Other allergy status, other than to drugs and biological substances: Secondary | ICD-10-CM

## 2017-05-02 DIAGNOSIS — L2089 Other atopic dermatitis: Secondary | ICD-10-CM | POA: Diagnosis not present

## 2017-05-02 DIAGNOSIS — H1010 Acute atopic conjunctivitis, unspecified eye: Secondary | ICD-10-CM | POA: Diagnosis not present

## 2017-05-02 DIAGNOSIS — T781XXA Other adverse food reactions, not elsewhere classified, initial encounter: Secondary | ICD-10-CM

## 2017-05-02 DIAGNOSIS — J4540 Moderate persistent asthma, uncomplicated: Secondary | ICD-10-CM | POA: Diagnosis not present

## 2017-05-02 DIAGNOSIS — J309 Allergic rhinitis, unspecified: Secondary | ICD-10-CM

## 2017-05-02 MED ORDER — AZELASTINE HCL 0.1 % NA SOLN: 2.0000 | Freq: Two times a day (BID) | NASAL | 5 refills | Status: DC

## 2017-05-02 NOTE — Progress Notes (Signed)
New Patient Note  RE: Michele Douglas MRN: [id number] DOB: [birth year] Date of Office Visit: 05/02/2017  Referring provider: Doristine Bosworth, MD Primary care provider: Doristine Bosworth, MD  Chief Complaint: allergies, asthma and rash  History of present illness: Michele Douglas is a 27 y.o. female presenting today for consultation for allergic rhinitis, asthma and rash.   She has allergic rhinitis with symptoms of itchy, watery eyes, nasal congestion, PND with dry cough.  These symptoms are "constant" year-round.  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  She states her job building did have a mildew issues months ago which she says was remedied.  She does nasal saline rinses as needed.  Flonase she was doing daily but stopped as she reports her nose became "raw".   She has used Xyzal which she states worked well at the beginning but became less effective over time.  Her xyzal was changed to singulair by her PCP in January 2019.  She has used benadryl as needed.  She has used Afrin when she completed nasally obstructed.  Other antihistamines she has tried in the past include Zyrtec and Allegra but it has been years since last use.  She saw an ophthalmologist last month and reports she was provided with a list of eye drops she can try but decided to wait for this appointment for further recommendations for eyedrops.  She does wear contacts that she takes out nightly.    She has history of asthma diagnosed in childhood.  Both parents and sister with asthma.  She reports having exercise induced symptoms as well as worsening symptoms when allergy symptoms are worse.  She reports having 2 flares/year on average.  She did have a flare in 1/19 and required a PCP visit and steroids.  She denies any nighttime awakenings between flares.  She reports symptoms include cough, wheeze and chest tightness during flares.  No hospitalization for asthma exacerbations.  She is on Qvar  readihaler 1 puff twice day.  Singulair was recently added in 1/19 as above.  She denies needing to use her albuterol in between flares.  She has history of eczema and has triamcinolone for as needed use.  The rash is only present on her neck.  She reports it comes up monthly when her allergy sypmptoms worse.  She does state that triamcinolone works.  She initially thought the rash could have been related to jewelry however she stopped wearing her necklace and it did not improve the rash.  However she does state that she feels she is allergic to nickel as she usually will get a rash around her bellybutton due to belt loops as well as would get a rash on her ears with no containing earrings.  She avoids nickel jewelry.  She states she is lactose intolerant and does not eat any dairy based products.  She reports she gets extreme gas, cramping and diarrhea and has had vomiting once before with dairy ingestion.  She has been avoiding dairy since childhood.  She does not recall the first reaction to dairy.  She drinks almond milk and is content without dairy ingestion.   Review of systems: Review of Systems  Constitutional: Negative for chills, fever and malaise/fatigue.  HENT: Positive for congestion. Negative for ear discharge, ear pain, nosebleeds, sinus pain and sore throat.   Eyes: Negative for pain, discharge and redness.  Respiratory: Negative for cough, sputum production, shortness of breath and wheezing.  Cardiovascular: Negative for chest pain.  Gastrointestinal: Negative for abdominal pain, constipation, diarrhea, heartburn, nausea and vomiting.  Musculoskeletal: Negative for joint pain and myalgias.  Skin: Positive for itching and rash.  Neurological: Negative for headaches.    All other systems negative unless noted above in HPI  Past medical history: Past Medical History:  Diagnosis Date  . Asthma   . Essential tremor   . Lactose intolerance   . LGSIL (low grade squamous  intraepithelial dysplasia) 07/2013   LGSIL on colposcopic biopsy, ECC negative  . Pseudotumor cerebri   . Tremor     Past surgical history: Past Surgical History:  Procedure Laterality Date  . BREAST SURGERY  2006   cystectomy  . TYMPANOPLASTY     left x 2    Family history:  Family History  Problem Relation Age of Onset  . Hypertension Mother   . Migraines Mother   . AAA (abdominal aortic aneurysm) Father   . Migraines Sister   . Myasthenia gravis Sister     Social history: She lives in a home with carpeting with gas heating and central cooling.  There is a dog in the home.  There is no concern for water damage, mildew or roaches in the home.  She is a Stage managermedical scribe.  She denies a smoking history.   Medication List: Allergies as of 05/02/2017      Reactions   Lactose Other (See Comments)   Nausea, vomiting, diarrhea.    Diamox [acetazolamide] Other (See Comments)   Tingling in fingers and lips      Medication List        Accurate as of 05/02/17  2:04 PM. Always use your most recent med list.          albuterol 108 (90 Base) MCG/ACT inhaler Commonly known as:  PROVENTIL HFA;VENTOLIN HFA Inhale 1-2 puffs into the lungs every 4 (four) hours as needed.   azelastine 0.1 % nasal spray Commonly known as:  ASTELIN Place 2 sprays into both nostrils 2 (two) times daily.   beclomethasone 40 MCG/ACT inhaler Commonly known as:  QVAR REDIHALER Inhale 1 puff into the lungs 2 (two) times daily.   fluticasone 50 MCG/ACT nasal spray Commonly known as:  FLONASE SHAKE LIQUID AND USE 2 SPRAYS IN EACH NOSTRIL DAILY   ibuprofen 800 MG tablet Commonly known as:  ADVIL,MOTRIN Take 1 tablet (800 mg total) by mouth every 8 (eight) hours as needed. For menstrual pain   meclizine 25 MG tablet Commonly known as:  ANTIVERT Take 1 tablet (25 mg total) by mouth 3 (three) times daily as needed for dizziness.   montelukast 10 MG tablet Commonly known as:  SINGULAIR Take 1 tablet (10  mg total) by mouth at bedtime.   ondansetron 4 MG disintegrating tablet Commonly known as:  ZOFRAN ODT 4mg  ODT q6 hours prn nausea/vomit   Peak Flow Meter Devi Commonly known as:  PEAK AIR PEAK FLOW METER Use peak flow as instructed for asthma J45.41   propranolol ER 60 MG 24 hr capsule Commonly known as:  INDERAL LA Take 60 mg by mouth daily.   topiramate 50 MG tablet Commonly known as:  TOPAMAX Take 1 tablet (50 mg total) by mouth 2 (two) times daily.   triamcinolone cream 0.1 % Commonly known as:  KENALOG APPLY EXTERNALLY TO THE AFFECTED AREA TWICE DAILY       Known medication allergies: Allergies  Allergen Reactions  . Lactose Other (See Comments)    Nausea,  vomiting, diarrhea.   . Diamox [Acetazolamide] Other (See Comments)    Tingling in fingers and lips     Physical examination: Blood pressure 128/72, pulse 76, temperature 98.7 F (37.1 C), temperature source Oral, resp. rate 18, height 5\' 7"  (1.702 m), weight 260 lb (117.9 kg), SpO2 99 %.  General: Alert, interactive, in no acute distress. HEENT: PERRLA, TMs pearly gray, turbinates markedly edematous and pale with clear discharge, post-pharynx non erythematous. Neck: Supple without lymphadenopathy. Lungs: Clear to auscultation without wheezing, rhonchi or rales. {no increased work of breathing. CV: Normal S1, S2 without murmurs. Abdomen: Nondistended, nontender. Skin: Dry, mildly hyperpigmented, mildly thickened patches on the Lateral neck on left side. Extremities:  No clubbing, cyanosis or edema. Neuro:   Grossly intact.  Diagnositics/Labs:  Spirometry: FEV1: 2.9L  95%, FVC: 3.36L  94%, ratio consistent with nonobstructive pattern  Allergy testing: Environmental testing is positive to grasses, weeds, trees, pullulara molds.  Intradermal testing is positive to mold mix 2 and mite mix Milk skin prick testing is negative Allergy testing results were read and interpreted by provider, documented by clinical  staff.   Assessment and plan:   Allergic rhinoconjunctivitis  - allergy testing today is positive to grasses, weeds ,trees, molds and dust mites.  Allergen avoidance measures discussed and handouts provided  - for itchy/watery/red eyes use Pazeo or Pataday 1 drop each eye as needed daily  - for nasal congestion/stuffiness recommend use of Nasacort 2 sprays each nostril daily.  Use for 1-2 weeks at time before stopping once symptoms improve.  If having nosebleeds decrease to 3 days a week use of stop completely.   - for nasal drainage/post-nasal drip recommend nasal anthistamine like Astelin 2 sprays each nostril twice a day  - we will see if your insurance covers for Dymista which is a combination nasal spray with Flonase + Astelin.  If covered use 1 spray each nostril twice a day  - continue singulair 10mg  daily  - allergen immunotherapy discussed today including protocol, benefits and risk.  Informational packet provided including insurance codes.  Let us know if you would like to proceed with this treatment option.     Asthma, moderate persistent - one flare up this year; still under good control.   - continue Qvar redihaler 1 puffs twice a day.  If not meeting below goals increase to 2 puffs twice a day - continue singulair 10mg  daily  - have access to albuterol inhaler 2 puffs every 4-6 hours as needed for cough/wheeze/shortness of breath/chest tightness.  May use 15-20 minutes prior to activity.   Monitor frequency of use.    Asthma control goals:   Full participation in all desired activities (may need albuterol before activity)  Albuterol use two time or less a week on average (not counting use with activity)  Cough interfering with sleep two time or less a month  Oral steroids no more than once a year  No hospitalizations   Atopic dermatitis  - continue as needed use of triamcinolone with flares  - daily moisturization with emollients like Vaseline, Aquafor, Eucerin,  CeraVe  Nickel allergy   - avoid contact exposure to nickel based products  Follow-up 4-6 months or sooner if needed   I appreciate the opportunity to take part in Michele Douglas's care. Please do not hesitate to contact me with questions.  Sincerely,   Margo Aye, MD Allergy/Immunology [facility]

## 2017-05-02 NOTE — Patient Instructions (Addendum)
Allergic rhinoconjunctivitis  - allergy testing today is positive to grasses, weeds ,trees, molds and dust mites.  Allergen avoidance measures discussed and handouts provided  - for itchy/watery/red eyes use Pazeo or Pataday 1 drop each eye as needed daily  - for nasal congestion/stuffiness recommend use of Nasacort 2 sprays each nostril daily.  Use for 1-2 weeks at time before stopping once symptoms improve.  If having nosebleeds decrease to 3 days a week use of stop completely.   - for nasal drainage/post-nasal drip recommend nasal anthistamine like Astelin 2 sprays each nostril twice a day  - we will see if your insurance covers for Dymista which is a combination nasal spray with Flonase + Astelin.  If covered use 1 spray each nostril twice a day  - continue singulair 10mg  daily  - allergen immunotherapy discussed today including protocol, benefits and risk.  Informational packet provided including insurance codes.  Let us know if you would like to proceed with this treatment option.     Asthma, moderate persistent - one flare up this year; still under good control.   - continue Qvar redihaler 1 puffs twice a day.  If not meeting below goals increase to 2 puffs twice a day - continue singulair 10mg  daily  - have access to albuterol inhaler 2 puffs every 4-6 hours as needed for cough/wheeze/shortness of breath/chest tightness.  May use 15-20 minutes prior to activity.   Monitor frequency of use.    Asthma control goals:   Full participation in all desired activities (may need albuterol before activity)  Albuterol use two time or less a week on average (not counting use with activity)  Cough interfering with sleep two time or less a month  Oral steroids no more than once a year  No hospitalizations   Atopic dermatitis  - continue as needed use of triamcinolone with flares  - daily moisturization with emollients like Vaseline, Aquafor, Eucerin, CeraVe  Nickel allergy   - avoid  contact exposure to nickel based products  Follow-up 4-6 months or sooner if needed

## 2017-05-06 ENCOUNTER — Other Ambulatory Visit: Payer: Self-pay | Admitting: Physician Assistant

## 2017-05-06 DIAGNOSIS — J302 Other seasonal allergic rhinitis: Secondary | ICD-10-CM

## 2017-07-20 ENCOUNTER — Encounter: Payer: Self-pay | Admitting: Family Medicine

## 2017-07-20 ENCOUNTER — Other Ambulatory Visit: Payer: Self-pay

## 2017-07-20 ENCOUNTER — Ambulatory Visit: Payer: BLUE CROSS/BLUE SHIELD | Admitting: Family Medicine

## 2017-07-20 VITALS — BP 126/82 | HR 70 | Temp 99.1°F | Resp 17 | Ht 67.0 in | Wt 260.0 lb

## 2017-07-20 DIAGNOSIS — H9203 Otalgia, bilateral: Secondary | ICD-10-CM | POA: Diagnosis not present

## 2017-07-20 DIAGNOSIS — R6889 Other general symptoms and signs: Secondary | ICD-10-CM

## 2017-07-20 DIAGNOSIS — Z8349 Family history of other endocrine, nutritional and metabolic diseases: Secondary | ICD-10-CM

## 2017-07-20 MED ORDER — NEOMYCIN-POLYMYXIN-HC 3.5-10000-1 OT SUSP: 3.0000 [drp] | Freq: Three times a day (TID) | OTIC | Status: DC

## 2017-07-20 NOTE — Progress Notes (Signed)
Chief Complaint  Patient presents with  . ear pain x 2 weeks    hx of left ear surgery, left ear is burning and per pt she has pain in right ear and lymph node swollen on right side.  Tylenol and ice pack for symptoms    HPI   Pt reports that she has been having pain in the left ear that feels like it has been having burning pain for 2 weeks Inside the canal No pain with chewing No fevers or chills She states that the right ear has some pain with some enlargement of the lymph node near the     Past Medical History:  Diagnosis Date  . Asthma   . Essential tremor   . Lactose intolerance   . 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 Pseudotumor cerebri   . Tremor     Current Outpatient Medications  Medication Sig Dispense Refill  . albuterol (PROVENTIL HFA;VENTOLIN HFA) 108 (90 Base) MCG/ACT inhaler Inhale 1-2 puffs into the lungs every 4 (four) hours as needed. 1 Inhaler 1  . azelastine (ASTELIN) 0.1 % nasal spray Place 2 sprays into both nostrils 2 (two) times daily. 30 mL 5  . beclomethasone (QVAR REDIHALER) 40 MCG/ACT inhaler Inhale 1 puff into the lungs 2 (two) times daily. 10.6 g 3  . fluticasone (FLONASE) 50 MCG/ACT nasal spray SHAKE LIQUID AND USE 2 SPRAYS IN EACH NOSTRIL DAILY 50 g 0  . ibuprofen (ADVIL,MOTRIN) 800 MG tablet Take 1 tablet (800 mg total) by mouth every 8 (eight) hours as needed. For menstrual pain 60 tablet 1  . meclizine (ANTIVERT) 25 MG tablet Take 1 tablet (25 mg total) by mouth 3 (three) times daily as needed for dizziness. 30 tablet 1  . montelukast (SINGULAIR) 10 MG tablet Take 1 tablet (10 mg total) by mouth at bedtime. 30 tablet 3  . ondansetron (ZOFRAN ODT) 4 MG disintegrating tablet  ODT q6 hours prn nausea/vomit 10 tablet 0  . Peak Flow Meter (PEAK AIR PEAK FLOW METER) DEVI Use peak flow as instructed for asthma J45.41 1 each 0  . propranolol ER (INDERAL LA) 60 MG 24 hr capsule  Take 60 mg by mouth daily.  2  . topiramate (TOPAMAX) 50 MG tablet Take 1 tablet (50 mg total) by mouth 2 (two) times daily. 90 tablet 6  . triamcinolone cream (KENALOG) 0.1 % APPLY EXTERNALLY TO THE AFFECTED AREA TWICE DAILY 30 g 0   Current Facility-Administered Medications  Medication Dose Route Frequency Provider Last Rate Last Dose  . neomycin-polymyxin-hydrocortisone (CORTISPORIN) OTIC (EAR) suspension 3 drop  3 drop Both EARS Q8H Psalm Schappell A, MD        Allergies:  Allergies  Allergen Reactions  . Lactose Other (See Comments)    Nausea, vomiting, diarrhea.   . Diamox [Acetazolamide] Other (See Comments)    Tingling in fingers and lips    Past Surgical History:  Procedure Laterality Date  . BREAST SURGERY  2006   cystectomy  . TYMPANOPLASTY     left x 2    Social History   Socioeconomic History  . Marital status: Single    Spouse name: Not on file  . Number of children: 0  . Years of education: college  . Highest education level: Not on file  Occupational History  . Not on file  Social Needs  . Financial resource strain: Not on file  . Food insecurity:  Worry: Not on file    Inability: Not on file  . Transportation needs:    Medical: Not on file    Non-medical: Not on file  Tobacco Use  . Smoking status: Never Smoker  . Smokeless tobacco: Never Used  Substance and Sexual Activity  . Alcohol use: Yes    Alcohol/week: 0.0 oz    Comment: Wine or mixed drink ocassionally  . Drug use: No  . Sexual activity: Not Currently    Comment: 1st intercourse 27 yo-Fewer than 5 partners  Lifestyle  . Physical activity:    Days per week: Not on file    Minutes per session: Not on file  . Stress: Not on file  Relationships  . Social connections:    Talks on phone: Not on file    Gets together: Not on file    Attends religious service: Not on file    Active member of club or organization: Not on file    Attends meetings of clubs or organizations: Not on file      Relationship status: Not on file  Other Topics Concern  . Not on file  Social History Narrative   Patient drinks caffeine occasionally.   Patient is right handed.     Family History  Problem Relation Age of Onset  . Hypertension Mother   . Migraines Mother   . AAA (abdominal aortic aneurysm) Father   . Migraines Sister   . Myasthenia gravis Sister      ROS Review of Systems See HPI Constitution: No fevers or chills No malaise No diaphoresis Skin: No rash or itching Eyes: no blurry vision, no double vision Endocrine: +cold intolerance GU: no dysuria or hematuria Neuro: no dizziness or headaches all others reviewed and negative   Objective: Vitals:   07/20/17 1625  BP: 126/82  Pulse: 70  Resp: 17  Temp: 99.1 F (37.3 C)  TempSrc: Oral  SpO2: 100%  Weight: 260 lb (117.9 kg)  Height:  (1.702 m)    Physical Exam General: alert, oriented, in NAD Head: normocephalic, atraumatic, no sinus tenderness Eyes: EOM intact, no scleral icterus or conjunctival injection Ears: TM clear bilaterally Nose: mucosa nonerythematous, nonedematous Throat: no pharyngeal exudate or erythema Lymph: no posterior auricular, submental or cervical lymph adenopathy Heart: normal rate, normal sinus rhythm, no murmurs Lungs: clear to auscultation bilaterally, no wheezing   Assessment and Plan Niyonna was seen today for ear pain x 2 weeks.  Diagnoses and all orders for this visit:  Ear pain, bilateral -     neomycin-polymyxin-hydrocortisone (CORTISPORIN) OTIC (EAR) suspension 3 drop  no signs of perforation TM clear External canal clear No cerumen noted Would advised using cortisporin on a cotton swab  Cold intolerance Will screen for thyroid disease per patient request Family history positive Tomaz Janis A Andoni Busch

## 2017-07-20 NOTE — Patient Instructions (Addendum)
   IF you received an x-ray today, you will receive an invoice from [facility]. Please contact [facility] at [phone] with questions or concerns regarding your invoice.   IF you received labwork today, you will receive an invoice from [facility]. Please contact [facility] at [phone] with questions or concerns regarding your invoice.   Our billing staff will not be able to assist you with questions regarding bills from these companies.  You will be contacted with the lab results as soon as they are available. The fastest way to get your results is to activate your My Chart account. Instructions are located on the last page of this paperwork. If you have not heard from us regarding the results in 2 weeks, please contact this office.      Earache, Adult An earache, or ear pain, can be caused by many things, including:  An infection.  Ear wax buildup.  Ear pressure.  Something in the ear that should not be there (foreign body).  A sore throat.  Tooth problems.  Jaw problems.  Treatment of the earache will depend on the cause. If the cause is not clear or cannot be determined, you may need to watch your symptoms until your earache goes away or until a cause is found. Follow these instructions at home: Pay attention to any changes in your symptoms. Take these actions to help with your pain:  Take or apply over-the-counter and prescription medicines only as told by your health care provider.  If you were prescribed an antibiotic medicine, use it as told by your health care provider. Do not stop using the antibiotic even if you start to feel better.  Do not put anything in your ear other than medicine that is prescribed by your health care provider.  If directed, apply heat to the affected area as often as told by your health care provider. Use the heat source that your health care provider recommends, such as a moist heat pack or a heating pad. ? Place a  towel between your skin and the heat source. ? Leave the heat on for 20-30 minutes. ? Remove the heat if your skin turns bright red. This is especially important if you are unable to feel pain, heat, or cold. You may have a greater risk of getting burned.  If directed, put ice on the ear: ? Put ice in a plastic bag. ? Place a towel between your skin and the bag. ? Leave the ice on for 20 minutes, 2-3 times a day.  Try resting in an upright position instead of lying down. This may help to reduce pressure in your ear and relieve pain.  Chew gum if it helps to relieve your ear pain.  Treat any allergies as told by your health care provider.  Keep all follow-up visits as told by your health care provider. This is important.  Contact a health care provider if:  Your pain does not improve within 2 days.  Your earache gets worse.  You have new symptoms.  You have a fever. Get help right away if:  You have a severe headache.  You have a stiff neck.  You have trouble swallowing.  You have redness or swelling behind your ear.  You have fluid or blood coming from your ear.  You have hearing loss.  You feel dizzy. This information is not intended to replace advice given to you by your health care provider. Make sure you discuss any questions you have with   your health care provider. Document Released: 10/03/2003 Document Revised: 10/14/2015 Document Reviewed: 08/11/2015 Elsevier Interactive Patient Education  2018 [employer]vier Inc[employer]  

## 2017-07-21 LAB — TSH+FREE T4
Free T4: 1.13 ng/dL (ref 0.82–1.77)
TSH: 1.31 u[IU]/mL (ref 0.450–4.500)

## 2017-08-02 ENCOUNTER — Encounter: Payer: Self-pay | Admitting: Gynecology

## 2017-08-02 ENCOUNTER — Ambulatory Visit: Payer: BLUE CROSS/BLUE SHIELD | Admitting: Gynecology

## 2017-08-02 VITALS — BP 118/76

## 2017-08-02 DIAGNOSIS — R102 Pelvic and perineal pain: Secondary | ICD-10-CM | POA: Diagnosis not present

## 2017-08-02 NOTE — Patient Instructions (Signed)
Call if the pelvic pain continues and will pursue a GYN ultrasound.

## 2017-08-02 NOTE — Progress Notes (Signed)
    Michele Douglas 05/04/1990 [id number]        27 y.o.  G0P0 presents with 3 to 4 days of sharp stabbing intermittent left pelvic/vaginal pain.  Patient feels it is very deep inside and feels like is in her vagina.  It has awoken her from sleep several times.  It occurs sporadically throughout the day regardless of activities whether she is sitting walking or laying down.  No nausea vomiting diarrhea constipation.  No urinary symptoms such as frequency dysuria urgency low back pain fever or chills.  Not sexually active.  Having regular monthly menses.  No vaginal discharge irritation itching or odor.  Past medical history,surgical history, problem list, medications, allergies, family history and social history were all reviewed and documented in the EPIC chart.  Directed ROS with pertinent positives and negatives documented in the history of present illness/assessment and plan.  Exam: Kennon PortelaKim Gardner assistant Vitals:   08/02/17 1359  BP: 118/76   General appearance:  Normal Abdomen soft nontender without masses guarding rebound Pelvic external BUS vagina normal.  No palpable or visual abnormalities.  No pain on vaginal mucosal pressure.  Cervix normal.  Uterus grossly normal midline mobile nontender.  Adnexa without masses or tenderness.  Assessment/Plan:  27 y.o. G0P0 3 to 4 days of left pelvic/vaginal pain.  Exam is normal.  No localizing symptoms.  She is exercising a fair amount and has lost a significant amount of weight.  Discussed possible muscle spasms within the levators or other pelvic musculature is possibility.  Given the normal exam and lack of other symptoms we will plan on observation for now.  If it continues she will call we will start with pelvic ultrasound to rule out ovarian process.  I discussed with her that is not classic and that it so fleeting and intermittent along with a normal exam that it does not overtly point towards ovarian as etiology.  Patient is due for her annual  exam coming up and will follow-up for that.  She will call those sooner if this pain persists through her next menses and we will pursue a pelvic ultrasound.    Dara Lordsimothy P Fontaine MD, 2:18 PM 08/02/2017

## 2017-08-03 LAB — URINALYSIS, COMPLETE W/RFL CULTURE
BACTERIA UA: NONE SEEN /HPF
Bilirubin Urine: NEGATIVE
Glucose, UA: NEGATIVE
HGB URINE DIPSTICK: NEGATIVE
HYALINE CAST: NONE SEEN /LPF
Ketones, ur: NEGATIVE
Leukocyte Esterase: NEGATIVE
Nitrites, Initial: NEGATIVE
PROTEIN: NEGATIVE
RBC / HPF: NONE SEEN /HPF (ref 0–2)
SQUAMOUS EPITHELIAL / LPF: NONE SEEN /HPF (ref <=5)
Specific Gravity, Urine: 1.022 (ref 1.001–1.03)
WBC, UA: NONE SEEN /HPF (ref 0–5)
pH: 6.5 (ref 5.0–8.0)

## 2017-08-03 LAB — NO CULTURE INDICATED

## 2017-08-17 ENCOUNTER — Other Ambulatory Visit: Payer: Self-pay | Admitting: Physician Assistant

## 2017-08-17 ENCOUNTER — Other Ambulatory Visit: Payer: Self-pay | Admitting: Neurology

## 2017-08-17 DIAGNOSIS — J4521 Mild intermittent asthma with (acute) exacerbation: Secondary | ICD-10-CM

## 2017-08-17 DIAGNOSIS — R0989 Other specified symptoms and signs involving the circulatory and respiratory systems: Secondary | ICD-10-CM

## 2017-08-30 ENCOUNTER — Encounter: Payer: Self-pay | Admitting: Gynecology

## 2017-08-30 ENCOUNTER — Ambulatory Visit: Payer: BLUE CROSS/BLUE SHIELD | Admitting: Gynecology

## 2017-08-30 VITALS — BP 116/76 | Ht 67.0 in | Wt 243.0 lb

## 2017-08-30 DIAGNOSIS — Z01419 Encounter for gynecological examination (general) (routine) without abnormal findings: Secondary | ICD-10-CM | POA: Diagnosis not present

## 2017-08-30 DIAGNOSIS — N6489 Other specified disorders of breast: Secondary | ICD-10-CM

## 2017-08-30 NOTE — Patient Instructions (Signed)
Follow-up in 1 year, sooner if any issues 

## 2017-08-30 NOTE — Progress Notes (Signed)
    Michele Douglas Sermon 07-13-1990 [id number]        27 y.o.  G0P0 for annual gynecologic exam.  Patient has noticed some crusting on both nipples that comes and goes.  No frank discharge.  No blood.  No palpable abnormalities.  Was seen last month with pelvic pain.  She notes that this is all resolved and she is not having any issues now.  Past medical history,surgical history, problem list, medications, allergies, family history and social history were all reviewed and documented as reviewed in the EPIC chart.  ROS:  Performed with pertinent positives and negatives included in the history, assessment and plan.   Additional significant findings : None   Exam: Kennon PortelaKim Gardner assistant Vitals:   08/30/17 1425  BP: 116/76  Weight: 243 lb (110.2 kg)  Height: 5\' 7"  (1.702 m)   Body mass index is 38.06 kg/m.  General appearance:  Normal affect, orientation and appearance. Skin: Grossly normal HEENT: Without gross lesions.  No cervical or supraclavicular adenopathy. Thyroid normal.  Lungs:  Clear without wheezing, rales or rhonchi Cardiac: RR, without RMG Abdominal:  Soft, nontender, without masses, guarding, rebound, organomegaly or hernia Breasts:  Examined lying and sitting without masses, retractions, discharge or axillary adenopathy.  Mild crusting noted at the tips of both nipples.  No inflammatory changes palpable masses or any discharge on manipulation. Pelvic:  Ext, BUS, Vagina: Normal  Cervix: Normal  Uterus: Anteverted, normal size, shape and contour, midline and mobile nontender   Adnexa: Without masses or tenderness    Anus and perineum: Normal    Assessment/Plan:  27 y.o. G0P0 female for annual gynecologic exam with regular menses, not sexually active.   1. Bilateral nipple crusting intermittently.  Has mild crusting now.  Breast exam otherwise normal.  No frank discharge.  Will check baseline prolactin.  Recommended patient use skin moisturizer on her nipples to see if this  does not help.  Assuming this resolves then she will monitor.  If ever notices any discharge/bleeding/palpable masses she will follow-up ASAP for further evaluation. 2. Contraception not an issue. 3. History LGSIL 2015.  Colposcopy with biopsy showed LGSIL with negative ECC.  Pap smear/HPV 2016 was negative.  Pap smear 2017 and 2018 were both negative.  No Pap smear done today.  We will plan less frequent screening intervals at 3-year interval. 4. Health maintenance.  CBC and CMP done today.  Lipid profile last year excellent.  Recent TSH reported normal.  Follow-up in 1 year, sooner if any issues.   Dara Lordsimothy P Fontaine MD, 2:50 PM 08/30/2017

## 2017-08-31 LAB — COMPREHENSIVE METABOLIC PANEL
AG RATIO: 1.5 (calc) (ref 1.0–2.5)
ALBUMIN MSPROF: 4.1 g/dL (ref 3.6–5.1)
ALKALINE PHOSPHATASE (APISO): 67 U/L (ref 33–115)
ALT: 7 U/L (ref 6–29)
AST: 13 U/L (ref 10–30)
BILIRUBIN TOTAL: 0.6 mg/dL (ref 0.2–1.2)
BUN: 13 mg/dL (ref 7–25)
CHLORIDE: 106 mmol/L (ref 98–110)
CO2: 17 mmol/L — AB (ref 20–32)
CREATININE: 0.89 mg/dL (ref 0.50–1.10)
Calcium: 9.1 mg/dL (ref 8.6–10.2)
GLOBULIN: 2.8 g/dL (ref 1.9–3.7)
Glucose, Bld: 88 mg/dL (ref 65–99)
Potassium: 4.2 mmol/L (ref 3.5–5.3)
Sodium: 135 mmol/L (ref 135–146)
Total Protein: 6.9 g/dL (ref 6.1–8.1)

## 2017-08-31 LAB — CBC WITH DIFFERENTIAL/PLATELET
BASOS PCT: 0.9 %
Basophils Absolute: 63 cells/uL (ref 0–200)
EOS ABS: 140 {cells}/uL (ref 15–500)
Eosinophils Relative: 2 %
HEMATOCRIT: 38.2 % (ref 35.0–45.0)
HEMOGLOBIN: 12.7 g/dL (ref 11.7–15.5)
LYMPHS ABS: 2597 {cells}/uL (ref 850–3900)
MCH: 28.8 pg (ref 27.0–33.0)
MCHC: 33.2 g/dL (ref 32.0–36.0)
MCV: 86.6 fL (ref 80.0–100.0)
MPV: 10.4 fL (ref 7.5–12.5)
Monocytes Relative: 5.8 %
Neutro Abs: 3794 cells/uL (ref 1500–7800)
Neutrophils Relative %: 54.2 %
PLATELETS: 326 10*3/uL (ref 140–400)
RBC: 4.41 10*6/uL (ref 3.80–5.10)
RDW: 12.8 % (ref 11.0–15.0)
TOTAL LYMPHOCYTE: 37.1 %
WBC: 7 10*3/uL (ref 3.8–10.8)
WBCMIX: 406 {cells}/uL (ref 200–950)

## 2017-08-31 LAB — PROLACTIN: PROLACTIN: 14.5 ng/mL

## 2017-09-12 ENCOUNTER — Other Ambulatory Visit: Payer: Self-pay | Admitting: Family Medicine

## 2017-09-12 DIAGNOSIS — L309 Dermatitis, unspecified: Secondary | ICD-10-CM

## 2017-09-13 NOTE — Telephone Encounter (Signed)
Triamcinolone cream refill Last Refill:03/19/17 # 30g Last OV: 03/19/17 PCP: Dr Creta LevinStallings Pharm[facility] [address] [facility]

## 2017-09-29 ENCOUNTER — Encounter: Payer: Self-pay | Admitting: Allergy

## 2017-09-29 ENCOUNTER — Ambulatory Visit: Payer: BLUE CROSS/BLUE SHIELD | Admitting: Allergy

## 2017-09-29 ENCOUNTER — Encounter: Payer: Self-pay | Admitting: Neurology

## 2017-09-29 ENCOUNTER — Ambulatory Visit: Payer: BLUE CROSS/BLUE SHIELD | Admitting: Neurology

## 2017-09-29 VITALS — BP 120/68 | HR 85 | Resp 17

## 2017-09-29 VITALS — BP 125/83 | HR 70 | Ht 67.0 in | Wt 246.0 lb

## 2017-09-29 DIAGNOSIS — L2089 Other atopic dermatitis: Secondary | ICD-10-CM | POA: Diagnosis not present

## 2017-09-29 DIAGNOSIS — J4540 Moderate persistent asthma, uncomplicated: Secondary | ICD-10-CM

## 2017-09-29 DIAGNOSIS — Z9109 Other allergy status, other than to drugs and biological substances: Secondary | ICD-10-CM

## 2017-09-29 DIAGNOSIS — H1010 Acute atopic conjunctivitis, unspecified eye: Secondary | ICD-10-CM

## 2017-09-29 DIAGNOSIS — G932 Benign intracranial hypertension: Secondary | ICD-10-CM | POA: Diagnosis not present

## 2017-09-29 DIAGNOSIS — J309 Allergic rhinitis, unspecified: Secondary | ICD-10-CM

## 2017-09-29 MED ORDER — BECLOMETHASONE DIPROP HFA 40 MCG/ACT IN AERB: 1.0000 | INHALATION_SPRAY | Freq: Two times a day (BID) | RESPIRATORY_TRACT | 5 refills | Status: DC

## 2017-09-29 MED ORDER — TRIAMCINOLONE ACETONIDE 0.1 % EX CREA: TOPICAL_CREAM | Freq: Two times a day (BID) | CUTANEOUS | 5 refills | Status: DC

## 2017-09-29 MED ORDER — FLUTICASONE PROPIONATE 50 MCG/ACT NA SUSP: NASAL | 1 refills | Status: AC

## 2017-09-29 MED ORDER — ALBUTEROL SULFATE HFA 108 (90 BASE) MCG/ACT IN AERS: INHALATION_SPRAY | RESPIRATORY_TRACT | 1 refills | Status: DC

## 2017-09-29 MED ORDER — MONTELUKAST SODIUM 10 MG PO TABS: 10.0000 mg | ORAL_TABLET | Freq: Every day | ORAL | 5 refills | Status: DC

## 2017-09-29 MED ORDER — OLOPATADINE HCL 0.7 % OP SOLN: 1.0000 [drp] | Freq: Every day | OPHTHALMIC | 5 refills | Status: AC

## 2017-09-29 MED ORDER — AZELASTINE HCL 0.1 % NA SOLN: 2.0000 | Freq: Two times a day (BID) | NASAL | 5 refills | Status: AC

## 2017-09-29 NOTE — Patient Instructions (Addendum)
   We will decrease the Topamax to 50 mg daily for 4 weeks, then stop if the headaches do not return.

## 2017-09-29 NOTE — Progress Notes (Signed)
Reason for visit: Headaches, pseudotumor cerebri  Michele Douglas is an 27 y.o. female  History of present illness:  [name] is a 27 year old right-handed black female with a history of pseudotumor cerebri and headache.  The patient has been on Topamax currently on 50 mg twice daily.  The patient has been seen by the office of Dr. Elmer Picker, she was told that there was no significant evidence of papilledema.  The patient has done quite well, she is not having any headaches whatsoever.  She does report occasional episodes of vertigo and has to take meclizine for this.  The patient returns to this office for an evaluation.  Overall, she is doing quite well.  At night with driving, the patient indicates that she has troubles with the lights, she may occasionally have some double vision.  She does not have problems during the day.  Past Medical History:  Diagnosis Date  . Asthma   . Essential tremor   . Lactose intolerance   . 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 Pseudotumor cerebri   . Tremor     Past Surgical History:  Procedure Laterality Date  . BREAST SURGERY  2006   cystectomy  . TYMPANOPLASTY     left x 2    Family History  Problem Relation Age of Onset  . Hypertension Mother   . Migraines Mother   . AAA (abdominal aortic aneurysm) Father   . Migraines Sister   . Myasthenia gravis Sister     Social history:  reports that she has never smoked. She has never used smokeless tobacco. She reports that she drinks alcohol. She reports that she does not use drugs.    Allergies  Allergen Reactions  . Lactose Other (See Comments)    Nausea, vomiting, diarrhea.   . Diamox [Acetazolamide] Other (See Comments)    Tingling in fingers and lips    Medications:  Prior to Admission medications   Medication Sig Start Date End Date Taking? Authorizing Provider  azelastine (ASTELIN) 0.1 % nasal spray Place 2  sprays into both nostrils 2 (two) times daily. 05/02/17  Yes Padgett, Pilar Grammes, MD  beclomethasone (QVAR REDIHALER) 40 MCG/ACT inhaler Inhale 1 puff into the lungs 2 (two) times daily. 12/24/16  Yes Shade Flood, MD  fluticasone (FLONASE) 50 MCG/ACT nasal spray SHAKE LIQUID AND USE 2 SPRAYS IN EACH NOSTRIL DAILY 05/06/17  Yes Stallings, Zoe A, MD  ibuprofen (ADVIL,MOTRIN) 800 MG tablet Take 1 tablet (800 mg total) by mouth every 8 (eight) hours as needed. For menstrual pain 01/26/17  Yes Doristine Bosworth, MD  meclizine (ANTIVERT) 25 MG tablet Take 1 tablet (25 mg total) by mouth 3 (three) times daily as needed for dizziness. 04/25/17  Yes York Spaniel, MD  montelukast (SINGULAIR) 10 MG tablet Take 1 tablet (10 mg total) by mouth at bedtime. 03/19/17  Yes Doristine Bosworth, MD  ondansetron (ZOFRAN ODT) 4 MG disintegrating tablet 4mg  ODT q6 hours prn nausea/vomit 11/07/16  Yes Charlynne Pander, MD  Peak Flow Meter (PEAK AIR PEAK FLOW METER) DEVI Use peak flow as instructed for asthma J45.41 02/24/17  Yes Collie Siad A, MD  propranolol ER (INDERAL LA) 60 MG 24 hr capsule Take 60 mg by mouth daily. 01/27/15  Yes [provider]  topiramate (TOPAMAX) 50 MG tablet Take 1 tablet (50 mg total) by mouth 2 (two) times daily. 04/01/17  Yes Stephanie Acre  K, MD  triamcinolone cream (KENALOG) 0.1 % APPLY EXTERNALLY TO THE AFFECTED AREA TWICE DAILY 09/13/17  Yes Doristine BosworthStallings, Zoe A, MD  VENTOLIN HFA 108 (90 Base) MCG/ACT inhaler INHALE 1-2 PUFFS INTO THE LUNGS EVERY 4 HOURS AS NEEDED 08/18/17  Yes Ofilia Neaslark, Michael L, PA-C    ROS:  Out of a complete 14 system review of symptoms, the patient complains only of the following symptoms, and all other reviewed systems are negative.  Palpitations of the heart Swollen lymph nodes  Blood pressure 125/83, pulse 70, height 5\' 7"  (1.702 m), weight 246 lb (111.6 kg).  Physical Exam  General: The patient is alert and cooperative at the time of the  examination.  The patient is markedly obese.  Skin: No significant peripheral edema is noted.   Neurologic Exam  Mental status: The patient is alert and oriented x 3 at the time of the examination. The patient has apparent normal recent and remote memory, with an apparently normal attention span and concentration ability.   Cranial nerves: Facial symmetry is present. Speech is normal, no aphasia or dysarthria is noted. Extraocular movements are full. Visual fields are full.  Pupils are equal, round, and reactive to light.  Discs are flat bilaterally.  Motor: The patient has good strength in all 4 extremities.  Sensory examination: Soft touch sensation is symmetric on the face, arms, and legs.  Coordination: The patient has good finger-nose-finger and heel-to-shin bilaterally.  Gait and station: The patient has a normal gait. Tandem gait is normal. Romberg is negative. No drift is seen.  Reflexes: Deep tendon reflexes are symmetric.   Assessment/Plan:  1.  Pseudotumor cerebri, headache  2.  Episodic vertigo  The patient overall is doing quite well, she will taper off of the Topamax, she will go to 50 mg daily for 4 weeks and then stop the medication.  If the headaches return, she is to contact our office and we will get her back on the Topamax.  She will otherwise follow-up in 6 months.  [name] MD 09/29/2017 10:30 AM  [facility] [address] [place], [place] [id number]  Phone [phone] Fax [phone]

## 2017-09-29 NOTE — Patient Instructions (Addendum)
Allergic rhinoconjunctivitis  -continue avoidance measures for grasses, weeds ,trees, molds and dust mites.  - for itchy/watery/red eyes use Pazeo or Pataday 1 drop each eye as needed daily  - for nasal congestion/stuffiness recommend use of Nasacort 2 sprays each nostril daily.  Use for 1-2 weeks at time before stopping once symptoms improve.  If having nosebleeds decrease to 3 days a week use of stop completely.   - for nasal drainage/post-nasal drip recommend nasal anthistamine like Astelin 2 sprays each nostril twice a day  - continue singulair 10mg  daily  - allergen immunotherapy discussed again today including protocol, benefits and risk.  Informational packet provided including insurance codes.  Let us know if you would like to proceed with this treatment option.   This is the next best therapy option for you given maximum medication management.     Asthma, moderate persistent - well-controlled  - reduce Qvar redihaler to 1 puff twice a day.  If not meeting below goals increase to 2 puffs twice a day - continue singulair 10mg  daily  - have access to albuterol inhaler 2 puffs every 4-6 hours as needed for cough/wheeze/shortness of breath/chest tightness.  May use 15-20 minutes prior to activity.   Monitor frequency of use.    Asthma control goals:   Full participation in all desired activities (may need albuterol before activity)  Albuterol use two time or less a week on average (not counting use with activity)  Cough interfering with sleep two time or less a month  Oral steroids no more than once a year  No hospitalizations   Atopic dermatitis  - continue as needed use of triamcinolone with flares  - provided with eucrisa samples that you can use alone or layered triamcinolone  - daily moisturization with emollients like Vaseline, Aquafor, Eucerin, CeraVe  Nickel allergy   - avoid contact exposure to nickel based products  Follow-up 4-6 months or sooner if needed

## 2017-09-29 NOTE — Progress Notes (Signed)
Follow-up Note  RE: Michele Douglas MRN: [id number] DOB: [birth year] Date of Office Visit: 09/29/2017   History of present illness: Michele Douglas is a 27 y.o. female presenting today for follow-up of allergic rhinoconjunctivitis, asthma, atopic dermatitis, nickel allergy.  She was last seen in the office on 05/02/17 by myself.  She denies any major health changes, surgeries or hospitalizations since last visit.  She states she has been doing relatively well but has been having a lot of allergy symptoms including itchy/watery eyes, nasal drainage primarily.  She is using Pazeo which works well for her but she ran out of the this drop.  She is also using Nasocort and Astelin which does help with her nasal drainage.  She is only using Astelin once a day.  She does feel like she has more nasal drainage towards the end of the day.  She also is using Zyrtec as her antihistamine at this time as well as continued on Singulair. With her asthma she states she is only needed to use her albuterol 1-2 times while she was at the gym.  She denies any ED or urgent care visits or any need for oral steroids.  She denies any nighttime awakenings.  She is taking her Qvar 2 puffs twice a day. With her eczema she is having some minor flareups on her arms around her elbows and has been using triamcinolone with incomplete resolution. She continues to avoid nickel. 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  Review of systems: Review of Systems  Constitutional: Negative for chills, fever and malaise/fatigue.  HENT: Positive for congestion. Negative for ear discharge, ear pain, nosebleeds and sore throat.   Eyes: Negative for pain, discharge and redness.  Respiratory: Negative for cough, shortness of breath and wheezing.   Cardiovascular: Negative for chest pain.  Gastrointestinal: Negative for abdominal pain, constipation, diarrhea, heartburn, nausea and vomiting.  Musculoskeletal: Negative for joint  pain.  Skin: Positive for itching and rash.  Neurological: Negative for headaches.    All other systems negative unless noted above in HPI  Past medical/social/surgical/family history have been reviewed and are unchanged unless specifically indicated below.  No changes  Medication List: Allergies as of 09/29/2017      Reactions   Lactose Other (See Comments)   Nausea, vomiting, diarrhea.    Diamox [acetazolamide] Other (See Comments)   Tingling in fingers and lips      Medication List        Accurate as of 09/29/17  6:22 PM. Always use your most recent med list.          albuterol 108 (90 Base) MCG/ACT inhaler Commonly known as:  VENTOLIN HFA INHALE 1-2 PUFFS INTO THE LUNGS EVERY 4 HOURS AS NEEDED   azelastine 0.1 % nasal spray Commonly known as:  ASTELIN Place 2 sprays into both nostrils 2 (two) times daily.   beclomethasone 40 MCG/ACT inhaler Commonly known as:  QVAR REDIHALER Inhale 1 puff into the lungs 2 (two) times daily.   fluticasone 50 MCG/ACT nasal spray Commonly known as:  FLONASE SHAKE LIQUID AND USE 2 SPRAYS IN EACH NOSTRIL DAILY   ibuprofen 800 MG tablet Commonly known as:  ADVIL,MOTRIN Take 1 tablet (800 mg total) by mouth every 8 (eight) hours as needed. For menstrual pain   meclizine 25 MG tablet Commonly known as:  ANTIVERT Take 1 tablet (25 mg total) by mouth 3 (three) times daily as needed for dizziness.   montelukast 10 MG  tablet Commonly known as:  SINGULAIR Take 1 tablet (10 mg total) by mouth at bedtime.   Olopatadine HCl 0.7 % Soln Commonly known as:  PAZEO Place 1 drop into both eyes daily.   ondansetron 4 MG disintegrating tablet Commonly known as:  ZOFRAN ODT 4mg  ODT q6 hours prn nausea/vomit   Peak Flow Meter Devi Commonly known as:  PEAK AIR PEAK FLOW METER Use peak flow as instructed for asthma J45.41   propranolol ER 60 MG 24 hr capsule Commonly known as:  INDERAL LA Take 60 mg by mouth daily.   topiramate 50 MG  tablet Commonly known as:  TOPAMAX Take 1 tablet (50 mg total) by mouth 2 (two) times daily.   triamcinolone cream 0.1 % Commonly known as:  KENALOG Apply topically 2 (two) times daily.       Known medication allergies: Allergies  Allergen Reactions  . Lactose Other (See Comments)    Nausea, vomiting, diarrhea.   . Diamox [Acetazolamide] Other (See Comments)    Tingling in fingers and lips     Physical examination: Blood pressure 120/68, pulse 85, resp. rate 17, SpO2 98 %.  General: Alert, interactive, in no acute distress. HEENT: PERRLA, TMs pearly gray, turbinates minimally edematous with clear discharge, post-pharynx non erythematous. Neck: Supple without lymphadenopathy. Lungs: Clear to auscultation without wheezing, rhonchi or rales. {no increased work of breathing. CV: Normal S1, S2 without murmurs. Abdomen: Nondistended, nontender. Skin: Warm and dry, without lesions or rashes. Extremities:  No clubbing, cyanosis or edema. Neuro:   Grossly intact.  Diagnositics/Labs: Spirometry: FEV1: 3.02L 98%, FVC: 3.61L 100%, ratio consistent with nonobstructive pattern  Assessment and plan:   Allergic rhinoconjunctivitis  -continue avoidance measures for grasses, weeds ,trees, molds and dust mites.  - for itchy/watery/red eyes use Pazeo or Pataday 1 drop each eye as needed daily  - for nasal congestion/stuffiness recommend use of Nasacort 2 sprays each nostril daily.  Use for 1-2 weeks at time before stopping once symptoms improve.  If having nosebleeds decrease to 3 days a week use of stop completely.   - for nasal drainage/post-nasal drip recommend nasal anthistamine like Astelin 2 sprays each nostril twice a day  - continue singulair 10mg  daily  - allergen immunotherapy discussed again today including protocol, benefits and risk.  Informational packet provided including insurance codes.  Let us know if you would like to proceed with this treatment option.   This is the next  best therapy option for you given maximum medication management.    Asthma, moderate persistent - well-controlled  - reduce Qvar redihaler to 1 puff twice a day.  If not meeting below goals increase to 2 puffs twice a day - continue singulair 10mg  daily  - have access to albuterol inhaler 2 puffs every 4-6 hours as needed for cough/wheeze/shortness of breath/chest tightness.  May use 15-20 minutes prior to activity.   Monitor frequency of use.    Asthma control goals:   Full participation in all desired activities (may need albuterol before activity)  Albuterol use two time or less a week on average (not counting use with activity)  Cough interfering with sleep two time or less a month  Oral steroids no more than once a year  No hospitalizations  Atopic dermatitis  - continue as needed use of triamcinolone with flares  - provided with eucrisa samples that you can use alone or layered triamcinolone  - daily moisturization with emollients like Vaseline, Aquafor, Eucerin, CeraVe  Nickel allergy   -  avoid contact exposure to nickel based products  Follow-up 4-6 months or sooner if needed  I appreciate the opportunity to take part in Smantha's care. Please do not hesitate to contact me with questions.  Sincerely,   Margo AyeShaylar Kalissa Grays, MD Allergy/Immunology [facility]

## 2017-10-10 ENCOUNTER — Other Ambulatory Visit: Payer: Self-pay | Admitting: Family Medicine

## 2017-10-10 NOTE — Telephone Encounter (Signed)
singulair refill Last Refill:09/29/17 # 30 tabs Last OV: 07/20/17 PCP: Laurence AlyZ. Stallings, MD Pharmacy:[facility]

## 2017-11-09 ENCOUNTER — Other Ambulatory Visit: Payer: Self-pay | Admitting: Neurology

## 2017-11-09 ENCOUNTER — Other Ambulatory Visit: Payer: Self-pay | Admitting: Family Medicine

## 2017-11-09 DIAGNOSIS — L309 Dermatitis, unspecified: Secondary | ICD-10-CM

## 2017-11-10 ENCOUNTER — Telehealth: Payer: Self-pay | Admitting: Neurology

## 2017-11-10 MED ORDER — TOPIRAMATE 50 MG PO TABS: 50.0000 mg | ORAL_TABLET | Freq: Two times a day (BID) | ORAL | 1 refills | Status: DC

## 2017-11-10 NOTE — Telephone Encounter (Signed)
The prescription for Topamax was sent in.

## 2017-11-10 NOTE — Telephone Encounter (Signed)
Pt called to check on refill, she said the pharmacy has not rec'd anything back. I read to her that she was to taper off the medication. Pt said she is taking 1 am and 1 pm but has no intention of tapering off the medication. Please call to advise

## 2017-12-09 ENCOUNTER — Encounter: Payer: Self-pay | Admitting: Emergency Medicine

## 2017-12-09 ENCOUNTER — Ambulatory Visit: Payer: BLUE CROSS/BLUE SHIELD | Admitting: Emergency Medicine

## 2017-12-09 ENCOUNTER — Other Ambulatory Visit: Payer: Self-pay

## 2017-12-09 VITALS — BP 112/75 | HR 72 | Temp 98.0°F | Resp 16 | Ht 67.72 in | Wt 250.0 lb

## 2017-12-09 DIAGNOSIS — R21 Rash and other nonspecific skin eruption: Secondary | ICD-10-CM | POA: Diagnosis not present

## 2017-12-09 DIAGNOSIS — L819 Disorder of pigmentation, unspecified: Secondary | ICD-10-CM

## 2017-12-09 NOTE — Progress Notes (Signed)
Michele Douglas 27 y.o.   Chief Complaint  Patient presents with  . Rash    discoloration on her forehead for a while     HISTORY OF PRESENT ILLNESS: This is a 27 y.o. female complaining of skin discoloration of hairline and also left cheek that started several months ago.  No other significant symptoms.  HPI   Prior to Admission medications   Medication Sig Start Date End Date Taking? Authorizing Provider  albuterol (VENTOLIN HFA) 108 (90 Base) MCG/ACT inhaler INHALE 1-2 PUFFS INTO THE LUNGS EVERY 4 HOURS AS NEEDED 09/29/17  Yes Padgett, Pilar Grammes, MD  azelastine (ASTELIN) 0.1 % nasal spray Place 2 sprays into both nostrils 2 (two) times daily. 09/29/17  Yes Padgett, Pilar Grammes, MD  beclomethasone (QVAR REDIHALER) 40 MCG/ACT inhaler Inhale 1 puff into the lungs 2 (two) times daily. 09/29/17  Yes Marcelyn Bruins, MD  beclomethasone (QVAR) 40 MCG/ACT inhaler Inhale into the lungs. 02/16/15  Yes [provider]  fluticasone (FLONASE) 50 MCG/ACT nasal spray SHAKE LIQUID AND USE 2 SPRAYS IN EACH NOSTRIL DAILY 09/29/17  Yes Padgett, Pilar Grammes, MD  ibuprofen (ADVIL,MOTRIN) 800 MG tablet Take 1 tablet (800 mg total) by mouth every 8 (eight) hours as needed. For menstrual pain 01/26/17  Yes Doristine Bosworth, MD  levocetirizine (XYZAL) 5 MG tablet Take by mouth. 02/09/16  Yes [provider]  meclizine (ANTIVERT) 25 MG tablet Take 1 tablet (25 mg total) by mouth 3 (three) times daily as needed for dizziness. 04/25/17  Yes York Spaniel, MD  montelukast (SINGULAIR) 10 MG tablet Take 1 tablet (10 mg total) by mouth at bedtime. 09/29/17  Yes Padgett, Pilar Grammes, MD  Olopatadine HCl (PAZEO) 0.7 % SOLN Place 1 drop into both eyes daily. 09/29/17  Yes Marcelyn Bruins, MD  ondansetron (ZOFRAN ODT) 4 MG disintegrating tablet 4mg  ODT q6 hours prn nausea/vomit 11/07/16  Yes Charlynne Pander, MD  Peak Flow Meter (PEAK AIR PEAK FLOW METER) DEVI Use peak  flow as instructed for asthma J45.41 02/24/17  Yes Collie Siad A, MD  propranolol ER (INDERAL LA) 60 MG 24 hr capsule Take 60 mg by mouth daily. 01/27/15  Yes [provider]  topiramate (TOPAMAX) 50 MG tablet Take 1 tablet (50 mg total) by mouth 2 (two) times daily. 11/10/17  Yes York Spaniel, MD  triamcinolone cream (KENALOG) 0.1 % APPLY EXTERNALLY TO THE AFFECTED AREA TWICE DAILY 11/09/17  Yes Doristine Bosworth, MD    Allergies  Allergen Reactions  . Lactose Other (See Comments)    Nausea, vomiting, diarrhea.   . Diamox [Acetazolamide] Other (See Comments)    Tingling in fingers and lips    Patient Active Problem List   Diagnosis Date Noted  . Dermatitis 03/19/2017  . Pseudotumor cerebri 04/21/2015  . Benign essential tremor 02/16/2015  . Intermittent vertigo 06/03/2014  . 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 Static tremor 04/17/2012    Past Medical History:  Diagnosis Date  . Asthma   . Essential tremor   . Lactose intolerance   . 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 Pseudotumor cerebri   . Tremor     Past Surgical History:  Procedure Laterality Date  . BREAST SURGERY  2006   cystectomy  . TYMPANOPLASTY     left x 2    Social History   Socioeconomic History  . Marital status: Single  Spouse name: Not on file  . Number of children: 0  . Years of education: college  . Highest education level: Not on file  Occupational History  . Not on file  Social Needs  . Financial resource strain: Not on file  . Food insecurity:    Worry: Not on file    Inability: Not on file  . Transportation needs:    Medical: Not on file    Non-medical: Not on file  Tobacco Use  . Smoking status: Never Smoker  . Smokeless tobacco: Never Used  Substance and Sexual Activity  . Alcohol use: Yes    Alcohol/week: 0.0 standard drinks    Comment: Wine or mixed drink  ocassionally  . Drug use: No  . Sexual activity: Never    Comment: Pt.declined sexual Hx questions  Lifestyle  . Physical activity:    Days per week: Not on file    Minutes per session: Not on file  . Stress: Not on file  Relationships  . Social connections:    Talks on phone: Not on file    Gets together: Not on file    Attends religious service: Not on file    Active member of club or organization: Not on file    Attends meetings of clubs or organizations: Not on file    Relationship status: Not on file  . Intimate partner violence:    Fear of current or ex partner: Not on file    Emotionally abused: Not on file    Physically abused: Not on file    Forced sexual activity: Not on file  Other Topics Concern  . Not on file  Social History Narrative   Patient drinks caffeine occasionally.   Patient is right handed.     Family History  Problem Relation Age of Onset  . Hypertension Mother   . Migraines Mother   . AAA (abdominal aortic aneurysm) Father   . Migraines Sister   . Myasthenia gravis Sister      Review of Systems  Constitutional: Negative.  Negative for chills and fever.  HENT: Negative.  Negative for sore throat.   Respiratory: Negative for shortness of breath.   Cardiovascular: Negative for chest pain and palpitations.  Gastrointestinal: Negative for nausea and vomiting.  Skin: Positive for rash.  Neurological: Negative for dizziness and headaches.  All other systems reviewed and are negative.   Vitals:   12/09/17 0959  BP: 112/75  Pulse: 72  Resp: 16  Temp: 98 F (36.7 C)  SpO2: 96%    Physical Exam  Constitutional: She is oriented to person, place, and time. She appears well-developed.  HENT:  Head: Normocephalic and atraumatic.  Eyes: Pupils are equal, round, and reactive to light. EOM are normal.  Neck: Normal range of motion. Neck supple.  Cardiovascular: Normal rate, regular rhythm and normal heart sounds.  Pulmonary/Chest: Effort  normal and breath sounds normal.  Musculoskeletal: Normal range of motion.  Neurological: She is alert and oriented to person, place, and time.  Skin: Skin is warm and dry. Capillary refill takes less than 2 seconds.  Hypopigmented skin at the hairline and also hypopigmented areas on left cheek.  Psychiatric: She has a normal mood and affect. Her behavior is normal.     ASSESSMENT & PLAN: Taniqua was seen today for rash.  Diagnoses and all orders for this visit:  Discoloration of skin of face  Rash and nonspecific skin eruption -     Ambulatory referral to Dermatology -  CBC with Differential/Platelet -     Comprehensive metabolic panel -     Hemoglobin A1c -     VITAMIN D 25 Hydroxy (Vit-D Deficiency, Fractures)    Patient Instructions       If you have lab work done today you will be contacted with your lab results within the next 2 weeks.  If you have not heard from [place] then please contact us. The fastest way to get your results is to register for My Chart.   IF you received an x-ray today, you will receive an invoice from [facility]. Please contact [facility] at [phone] with questions or concerns regarding your invoice.   IF you received labwork today, you will receive an invoice from [facility]. Please contact [facility] at [phone] with questions or concerns regarding your invoice.   Our billing staff will not be able to assist you with questions regarding bills from these companies.  You will be contacted with the lab results as soon as they are available. The fastest way to get your results is to activate your My Chart account. Instructions are located on the last page of this paperwork. If you have not heard from [place] regarding the results in 2 weeks, please contact this office.      Rash A rash is a change in the color of the skin. A rash can also change the way your skin feels. There are many different conditions and factors that can  cause a rash. Follow these instructions at home: Pay attention to any changes in your symptoms. Follow these instructions to help with your condition: Medicine Take or apply over-the-counter and prescription medicines only as told by your doctor. These may include:  Corticosteroid cream.  Anti-itch lotions.  Oral antihistamines.  Skin Care  Put cool compresses on the affected areas.  Try taking a bath with: ? Epsom salts. Follow the instructions on the packaging. You can get these at your local pharmacy or grocery store. ? Baking soda. Pour a small amount into the bath as told by your doctor. ? Colloidal oatmeal. Follow the instructions on the packaging. You can get this at your local pharmacy or grocery store.  Try putting baking soda paste onto your skin. Stir water into baking soda until it gets like a paste.  Do not scratch or rub your skin.  Avoid covering the rash. Make sure the rash is exposed to air as much as possible. General instructions  Avoid hot showers or baths, which can make itching worse. A cold shower may help.  Avoid scented soaps, detergents, and perfumes. Use gentle soaps, detergents, perfumes, and other cosmetic products.  Avoid anything that causes your rash. Keep a journal to help track what causes your rash. Write down: ? What you eat. ? What cosmetic products you use. ? What you drink. ? What you wear. This includes jewelry.  Keep all follow-up visits as told by your doctor. This is important. Contact a doctor if:  You sweat at night.  You lose weight.  You pee (urinate) more than normal.  You feel weak.  You throw up (vomit).  Your skin or the whites of your eyes look yellow (jaundice).  Your skin: ? Tingles. ? Is numb.  Your rash: ? Does not go away after a few days. ? Gets worse.  You are: ? More thirsty than normal. ? More tired than normal.  You have: ? New symptoms. ? Pain in your belly (abdomen). ? A  fever. ?  Watery poop (diarrhea). Get help right away if:  Your rash covers all or most of your body. The rash may or may not be painful.  You have blisters that: ? Are on top of the rash. ? Grow larger. ? Grow together. ? Are painful. ? Are inside your nose or mouth.  You have a rash that: ? Looks like purple pinprick-sized spots all over your body. ? Has a "bull's eye" or looks like a target. ? Is red and painful, causes your skin to peel, and is not from being in the sun too long. This information is not intended to replace advice given to you by your health care provider. Make sure you discuss any questions you have with your health care provider. Document Released: 08/04/2007 Document Revised: 07/24/2015 Document Reviewed: 07/03/2014 Elsevier Interactive Patient Education  2018 Elsevier Inc.      Edwina Barth, MD [facility]

## 2017-12-09 NOTE — Patient Instructions (Addendum)
   If you have lab work done today you will be contacted with your lab results within the next 2 weeks.  If you have not heard from us then please contact us. The fastest way to get your results is to register for My Chart.   IF you received an x-ray today, you will receive an invoice from [facility]. Please contact [facility] at [phone] with questions or concerns regarding your invoice.   IF you received labwork today, you will receive an invoice from [facility]. Please contact [facility] at [phone] with questions or concerns regarding your invoice.   Our billing staff will not be able to assist you with questions regarding bills from these companies.  You will be contacted with the lab results as soon as they are available. The fastest way to get your results is to activate your My Chart account. Instructions are located on the last page of this paperwork. If you have not heard from us regarding the results in 2 weeks, please contact this office.     Rash A rash is a change in the color of the skin. A rash can also change the way your skin feels. There are many different conditions and factors that can cause a rash. Follow these instructions at home: Pay attention to any changes in your symptoms. Follow these instructions to help with your condition: Medicine Take or apply over-the-counter and prescription medicines only as told by your doctor. These may include:  Corticosteroid cream.  Anti-itch lotions.  Oral antihistamines.  Skin Care  Put cool compresses on the affected areas.  Try taking a bath with: ? Epsom salts. Follow the instructions on the packaging. You can get these at your local pharmacy or grocery store. ? Baking soda. Pour a small amount into the bath as told by your doctor. ? Colloidal oatmeal. Follow the instructions on the packaging. You can get this at your local pharmacy or grocery store.  Try putting baking soda paste onto your  skin. Stir water into baking soda until it gets like a paste.  Do not scratch or rub your skin.  Avoid covering the rash. Make sure the rash is exposed to air as much as possible. General instructions  Avoid hot showers or baths, which can make itching worse. A cold shower may help.  Avoid scented soaps, detergents, and perfumes. Use gentle soaps, detergents, perfumes, and other cosmetic products.  Avoid anything that causes your rash. Keep a journal to help track what causes your rash. Write down: ? What you eat. ? What cosmetic products you use. ? What you drink. ? What you wear. This includes jewelry.  Keep all follow-up visits as told by your doctor. This is important. Contact a doctor if:  You sweat at night.  You lose weight.  You pee (urinate) more than normal.  You feel weak.  You throw up (vomit).  Your skin or the whites of your eyes look yellow (jaundice).  Your skin: ? Tingles. ? Is numb.  Your rash: ? Does not go away after a few days. ? Gets worse.  You are: ? More thirsty than normal. ? More tired than normal.  You have: ? New symptoms. ? Pain in your belly (abdomen). ? A fever. ? Watery poop (diarrhea). Get help right away if:  Your rash covers all or most of your body. The rash may or may not be painful.  You have blisters that: ? Are on top of the rash. ?   Grow larger. ? Grow together. ? Are painful. ? Are inside your nose or mouth.  You have a rash that: ? Looks like purple pinprick-sized spots all over your body. ? Has a "bull's eye" or looks like a target. ? Is red and painful, causes your skin to peel, and is not from being in the sun too long. This information is not intended to replace advice given to you by your health care provider. Make sure you discuss any questions you have with your health care provider. Document Released: 08/04/2007 Document Revised: 07/24/2015 Document Reviewed: 07/03/2014 Elsevier Interactive Patient  Education  2018 Elsevier Inc.  

## 2017-12-10 LAB — CBC WITH DIFFERENTIAL/PLATELET
BASOS: 1 %
Basophils Absolute: 0.1 10*3/uL (ref 0.0–0.2)
EOS (ABSOLUTE): 0.1 10*3/uL (ref 0.0–0.4)
EOS: 2 %
Hematocrit: 40 % (ref 34.0–46.6)
Hemoglobin: 12.9 g/dL (ref 11.1–15.9)
IMMATURE GRANS (ABS): 0 10*3/uL (ref 0.0–0.1)
IMMATURE GRANULOCYTES: 0 %
LYMPHS: 35 %
Lymphocytes Absolute: 2.2 10*3/uL (ref 0.7–3.1)
MCH: 28.7 pg (ref 26.6–33.0)
MCHC: 32.3 g/dL (ref 31.5–35.7)
MCV: 89 fL (ref 79–97)
MONOCYTES: 7 %
MONOS ABS: 0.4 10*3/uL (ref 0.1–0.9)
NEUTROS PCT: 55 %
Neutrophils Absolute: 3.5 10*3/uL (ref 1.4–7.0)
PLATELETS: 363 10*3/uL (ref 150–450)
RBC: 4.5 x10E6/uL (ref 3.77–5.28)
RDW: 12.5 % (ref 12.3–15.4)
WBC: 6.3 10*3/uL (ref 3.4–10.8)

## 2017-12-10 LAB — HEMOGLOBIN A1C
Est. average glucose Bld gHb Est-mCnc: 105 mg/dL
Hgb A1c MFr Bld: 5.3 % (ref 4.8–5.6)

## 2017-12-10 LAB — COMPREHENSIVE METABOLIC PANEL
A/G RATIO: 1.6 (ref 1.2–2.2)
ALT: 9 IU/L (ref 0–32)
AST: 13 IU/L (ref 0–40)
Albumin: 4.2 g/dL (ref 3.5–5.5)
Alkaline Phosphatase: 73 IU/L (ref 39–117)
BUN / CREAT RATIO: 9 (ref 9–23)
BUN: 8 mg/dL (ref 6–20)
Bilirubin Total: 0.7 mg/dL (ref 0.0–1.2)
CALCIUM: 8.9 mg/dL (ref 8.7–10.2)
CHLORIDE: 106 mmol/L (ref 96–106)
CO2: 20 mmol/L (ref 20–29)
Creatinine, Ser: 0.85 mg/dL (ref 0.57–1.00)
GFR calc non Af Amer: 94 mL/min/{1.73_m2} (ref >59)
GFR, EST AFRICAN AMERICAN: 108 mL/min/{1.73_m2} (ref >59)
GLUCOSE: 98 mg/dL (ref 65–99)
Globulin, Total: 2.6 g/dL (ref 1.5–4.5)
POTASSIUM: 3.9 mmol/L (ref 3.5–5.2)
Sodium: 140 mmol/L (ref 134–144)
TOTAL PROTEIN: 6.8 g/dL (ref 6.0–8.5)

## 2017-12-10 LAB — VITAMIN D 25 HYDROXY (VIT D DEFICIENCY, FRACTURES): VIT D 25 HYDROXY: 12.4 ng/mL — AB (ref 30.0–100.0)

## 2017-12-12 ENCOUNTER — Encounter: Payer: Self-pay | Admitting: *Deleted

## 2017-12-12 ENCOUNTER — Encounter: Payer: Self-pay | Admitting: Emergency Medicine

## 2018-02-01 ENCOUNTER — Ambulatory Visit: Payer: BLUE CROSS/BLUE SHIELD | Admitting: Allergy

## 2018-02-16 ENCOUNTER — Encounter: Payer: Self-pay | Admitting: Allergy

## 2018-02-16 ENCOUNTER — Ambulatory Visit: Payer: BLUE CROSS/BLUE SHIELD | Admitting: Allergy

## 2018-02-16 VITALS — BP 124/80 | HR 72 | Resp 16

## 2018-02-16 DIAGNOSIS — H1013 Acute atopic conjunctivitis, bilateral: Secondary | ICD-10-CM

## 2018-02-16 DIAGNOSIS — J4540 Moderate persistent asthma, uncomplicated: Secondary | ICD-10-CM | POA: Diagnosis not present

## 2018-02-16 DIAGNOSIS — Z9109 Other allergy status, other than to drugs and biological substances: Secondary | ICD-10-CM

## 2018-02-16 DIAGNOSIS — L2089 Other atopic dermatitis: Secondary | ICD-10-CM | POA: Diagnosis not present

## 2018-02-16 DIAGNOSIS — J3089 Other allergic rhinitis: Secondary | ICD-10-CM

## 2018-02-16 NOTE — Addendum Note (Signed)
Addended by: Bennye AlmMIRANDA, Serene Kopf on: 02/16/2018 05:37 PM   Modules accepted: Orders

## 2018-02-16 NOTE — Progress Notes (Signed)
Follow-up Note  RE: Michele Douglas MRN: [id number] DOB: 17-Sep-1990 Date of Office Visit: 02/16/2018   History of present illness: Michele Douglas is a 27 y.o. female presenting today for follow-up of allergic rhinitis with conjunctivitis, asthma, eczema and contact dermatitis to nickel.  She last seen in the office on September 29, 2017 by myself.    We tried to decrease her Qvar dose to 1 puff twice a day however she became very symptomatic with this decrease. She states every time she would go to the gym she would have symptoms with cough and wheezing.  She went back to Qvar 2 puffs twice a day with resolution of symptoms.  She would like to be on a lower dose and states she would like to try 2 puffs in AM and 1 puff in PM.  She has not tried this yet.  She denies any nighttime awakenings or need for systemic steroids or UC/ED visits.     With her allergies she is taking singulair and xyzal daily.  She still has ocular symptoms with itch/watering and states that Pazeo works to best for her eye symptoms.  However current insurance doesn't cover and it was too much to afford.  She has tried Designer, television/film set but it wasn't as effective as Xyzal.  With change of insurance in new year she states she may want to start on allergen immunotherapy.    After her last visit she states she was getting light spotches on her body including her face.  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  She was prescribed a shampoo and a spf moisturizer and another cream she does not recall at this time.  It has cleared up the light areas.   She also has triamcinolone for as needed use with eczema flares.    She continues to avoid nickel products.   Review of systems: Review of Systems  Constitutional: Negative for chills, fever and malaise/fatigue.  HENT: Negative for congestion, ear discharge, nosebleeds and sore throat.   Eyes: Negative for pain, discharge and redness.  Respiratory: Positive for cough  and wheezing.   Cardiovascular: Negative for chest pain.  Gastrointestinal: Negative for abdominal pain, constipation, diarrhea, heartburn, nausea and vomiting.  Musculoskeletal: Negative for joint pain.  Skin: Negative for itching and rash.  Neurological: Negative for headaches.    All other systems negative unless noted above in HPI  Past medical/social/surgical/family history have been reviewed and are unchanged unless specifically indicated below.  No changes  Medication List: Allergies as of 02/16/2018      Reactions   Lactose Other (See Comments)   Nausea, vomiting, diarrhea.    Diamox [acetazolamide] Other (See Comments)   Tingling in fingers and lips      Medication List       Accurate as of February 16, 2018  5:18 PM. Always use your most recent med list.        albuterol 108 (90 Base) MCG/ACT inhaler Commonly known as:  VENTOLIN HFA INHALE 1-2 PUFFS INTO THE LUNGS EVERY 4 HOURS AS NEEDED   azelastine 0.1 % nasal spray Commonly known as:  ASTELIN Place 2 sprays into both nostrils 2 (two) times daily.   beclomethasone 40 MCG/ACT inhaler Commonly known as:  QVAR REDIHALER Inhale 1 puff into the lungs 2 (two) times daily.   fluticasone 50 MCG/ACT nasal spray Commonly known as:  FLONASE SHAKE LIQUID AND USE 2 SPRAYS IN EACH NOSTRIL DAILY   ibuprofen 800 MG tablet  Commonly known as:  ADVIL,MOTRIN Take 1 tablet (800 mg total) by mouth every 8 (eight) hours as needed. For menstrual pain   levocetirizine 5 MG tablet Commonly known as:  XYZAL Take by mouth.   meclizine 25 MG tablet Commonly known as:  ANTIVERT Take 1 tablet (25 mg total) by mouth 3 (three) times daily as needed for dizziness.   montelukast 10 MG tablet Commonly known as:  SINGULAIR Take 1 tablet (10 mg total) by mouth at bedtime.   Olopatadine HCl 0.7 % Soln Commonly known as:  PAZEO Place 1 drop into both eyes daily.   ondansetron 4 MG disintegrating tablet Commonly known as:   ZOFRAN ODT 4mg  ODT q6 hours prn nausea/vomit   Peak Flow Meter Devi Commonly known as:  PEAK AIR PEAK FLOW METER Use peak flow as instructed for asthma J45.41   propranolol ER 60 MG 24 hr capsule Commonly known as:  INDERAL LA Take 60 mg by mouth daily.   QVAR 40 MCG/ACT inhaler Generic drug:  beclomethasone Inhale into the lungs.   topiramate 50 MG tablet Commonly known as:  TOPAMAX Take 1 tablet (50 mg total) by mouth 2 (two) times daily.   triamcinolone cream 0.1 % Commonly known as:  KENALOG APPLY EXTERNALLY TO THE AFFECTED AREA TWICE DAILY       Known medication allergies: Allergies  Allergen Reactions  . Lactose Other (See Comments)    Nausea, vomiting, diarrhea.   . Diamox [Acetazolamide] Other (See Comments)    Tingling in fingers and lips     Physical examination: Blood pressure 124/80, pulse 72, resp. rate 16.  General: Alert, interactive, in no acute distress. HEENT: PERRLA, TMs pearly gray, turbinates minimally edematous without discharge, post-pharynx non erythematous. Neck: Supple without lymphadenopathy. Lungs: Clear to auscultation without wheezing, rhonchi or rales. {no increased work of breathing. CV: Normal S1, S2 without murmurs. Abdomen: Nondistended, nontender. Skin: Warm and dry, without lesions or rashes. Extremities:  No clubbing, cyanosis or edema. Neuro:   Grossly intact.  Diagnositics/Labs:  Spirometry: FEV1: 2.86L 94%, FVC: 3.25L 91%, ratio consistent with nonobstructive pattern  Assessment and plan:   Allergic rhinoconjunctivitis  -continue avoidance measures for grasses, weeds ,trees, molds and dust mites.  - for itchy/watery/red eyes use OTC Alaway or Zaditor 1 drop each eye twice a day as needed   - for nasal congestion/stuffiness recommend use of Nasacort 2 sprays each nostril daily.  Use for 1-2 weeks at time before stopping once symptoms improve.  If having nosebleeds decrease to 3 days a week use of stop completely.   -  for nasal drainage/post-nasal drip recommend nasal anthistamine like Astelin 2 sprays each nostril twice a day  - continue Xyzal 5mg  daily (can move up and take 20-30 minutes prior to exercise/activity and see if this helps to decrease prevent itchy rash with activity)  - continue singulair 10mg  daily  - allergen immunotherapy discussed including protocol, benefits and risk.  Informational packet provided including insurance codes.  Let us know in 2020 if you would like to proceed with this treatment option.   This is the next best therapy option for you given maximum medication management.    Asthma, moderate persistent - well-controlled  - reduce Qvar 40mcg redihaler to 2 puff in AM and 1 puff in PM twice a day.  If not meeting below goals increase to 2 puffs twice a day.  Continue loratadine. - continue singulair 10mg  daily  - have access to albuterol inhaler 2 puffs  every 4-6 hours as needed for cough/wheeze/shortness of breath/chest tightness.  May use 15-20 minutes prior to activity.   Monitor frequency of use.    Asthma control goals:   Full participation in all desired activities (may need albuterol before activity)  Albuterol use two time or less a week on average (not counting use with activity)  Cough interfering with sleep two time or less a month  Oral steroids no more than once a year  No hospitalizations   Atopic dermatitis  - continue as needed use of triamcinolone with flares  - provided with eucrisa samples that you can use alone or layered triamcinolone  - daily moisturization with emollients like Vaseline, Aquafor, Eucerin, CeraVe  Nickel allergy   - avoid contact exposure to nickel based products  Follow-up 6 months or sooner if needed I appreciate the opportunity to take part in Michele Douglas's care. Please do not hesitate to contact me with questions.  Sincerely,   Margo AyeShaylar Padgett, MD Allergy/Immunology [facility]

## 2018-02-16 NOTE — Patient Instructions (Addendum)
Allergic rhinoconjunctivitis  -continue avoidance measures for grasses, weeds ,trees, molds and dust mites.  - for itchy/watery/red eyes use OTC Alaway or Zaditor 1 drop each eye twice a day as needed   - for nasal congestion/stuffiness recommend use of Nasacort 2 sprays each nostril daily.  Use for 1-2 weeks at time before stopping once symptoms improve.  If having nosebleeds decrease to 3 days a week use of stop completely.   - for nasal drainage/post-nasal drip recommend nasal anthistamine like Astelin 2 sprays each nostril twice a day  - continue Xyzal 5mg  daily (can move up and take 20-30 minutes prior to exercise/activity and see if this helps to decrease prevent itchy rash with activity)  - continue singulair 10mg  daily  - allergen immunotherapy discussed including protocol, benefits and risk.  Informational packet provided including insurance codes.  Let us know in 2020 if you would like to proceed with this treatment option.   This is the next best therapy option for you given maximum medication management.     Asthma, moderate persistent - well-controlled  - reduce Qvar 40mcg redihaler to 2 puff in AM and 1 puff in PM twice a day.  If not meeting below goals increase to 2 puffs twice a day.  Continue loratadine. - continue singulair 10mg  daily  - have access to albuterol inhaler 2 puffs every 4-6 hours as needed for cough/wheeze/shortness of breath/chest tightness.  May use 15-20 minutes prior to activity.   Monitor frequency of use.    Asthma control goals:   Full participation in all desired activities (may need albuterol before activity)  Albuterol use two time or less a week on average (not counting use with activity)  Cough interfering with sleep two time or less a month  Oral steroids no more than once a year  No hospitalizations   Atopic dermatitis  - continue as needed use of triamcinolone with flares  - provided with eucrisa samples that you can use alone or layered  triamcinolone  - daily moisturization with emollients like Vaseline, Aquafor, Eucerin, CeraVe  Nickel allergy   - avoid contact exposure to nickel based products  Follow-up 6 months or sooner if needed

## 2018-02-26 ENCOUNTER — Other Ambulatory Visit: Payer: Self-pay | Admitting: Gynecology

## 2018-03-08 ENCOUNTER — Encounter: Payer: Self-pay | Admitting: Adult Health

## 2018-04-12 ENCOUNTER — Ambulatory Visit: Payer: BLUE CROSS/BLUE SHIELD | Admitting: Adult Health

## 2018-04-18 LAB — HM DIABETES EYE EXAM

## 2018-05-19 ENCOUNTER — Other Ambulatory Visit: Payer: Self-pay

## 2018-05-19 ENCOUNTER — Ambulatory Visit: Payer: BC Managed Care – PPO | Admitting: Family Medicine

## 2018-05-19 ENCOUNTER — Ambulatory Visit (INDEPENDENT_AMBULATORY_CARE_PROVIDER_SITE_OTHER): Payer: BC Managed Care – PPO

## 2018-05-19 ENCOUNTER — Encounter: Payer: Self-pay | Admitting: Family Medicine

## 2018-05-19 VITALS — BP 106/70 | HR 84 | Temp 97.8°F | Ht 67.0 in | Wt 262.2 lb

## 2018-05-19 DIAGNOSIS — M25562 Pain in left knee: Secondary | ICD-10-CM

## 2018-05-19 MED ORDER — MELOXICAM 7.5 MG PO TABS: 7.5000 mg | ORAL_TABLET | Freq: Every day | ORAL | 0 refills | Status: DC

## 2018-05-19 NOTE — Progress Notes (Signed)
Subjective:    Patient ID: Michele Douglas, female    DOB: 03-29-1990, 28 y.o.   MRN: [id number]  HPI Michele Douglas is a 28 y.o. female Presents today for: Chief Complaint  Patient presents with  . left leg pain    going on 1 month. near inner knee    Presents with knee pain. L knee pain.  Started on Feb 19th.  NKI, but may have injured in [place] - not sure of timing of Zumba and pain, no known injury at [facility].  Woke up with pain. Medial knee pain.  Some initial swelling, swelling improved in days.  Still icing at night, elevation at night.  Exercise on elliptical is tolerated.  Pain to walk without ibuprofen. Feels like locks in extension, feells like will give way, but has not given way.  Tx: ibuprofen 800mg  qd, tylenol 500mg .  Twisting inward is most pain. Pain with terminal extension. Sore to bend/squat.  No bracing.  No prior left knee issue.  Pain at night in sleep with turning.  Less exercise in months prior (had been exercising 5d/week until November).   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   Patient Active Problem List   Diagnosis Date Noted  . Dermatitis 03/19/2017  . Pseudotumor cerebri 04/21/2015  . Benign essential tremor 02/16/2015  . Intermittent vertigo 06/03/2014  . 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 Static tremor 04/17/2012   Past Medical History:  Diagnosis Date  . Asthma   . Essential tremor   . Lactose intolerance   . 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 Pseudotumor cerebri   . Tremor    Past Surgical History:  Procedure Laterality Date  . BREAST SURGERY  2006   cystectomy  . TYMPANOPLASTY     left x 2   Allergies  Allergen Reactions  . Lactose Other (See Comments)    Nausea, vomiting, diarrhea.   . Diamox [Acetazolamide] Other (See Comments)    Tingling in fingers and lips   Prior to  Admission medications   Medication Sig Start Date End Date Taking? Authorizing Provider  albuterol (VENTOLIN HFA) 108 (90 Base) MCG/ACT inhaler INHALE 1-2 PUFFS INTO THE LUNGS EVERY 4 HOURS AS NEEDED 09/29/17  Yes Padgett, Pilar Grammes, MD  azelastine (ASTELIN) 0.1 % nasal spray Place 2 sprays into both nostrils 2 (two) times daily. 09/29/17  Yes Marcelyn Bruins, MD  beclomethasone (QVAR) 40 MCG/ACT inhaler Inhale into the lungs. 02/16/15  Yes [provider]  fluticasone (FLONASE) 50 MCG/ACT nasal spray SHAKE LIQUID AND USE 2 SPRAYS IN EACH NOSTRIL DAILY 09/29/17  Yes Padgett, Pilar Grammes, MD  ibuprofen (ADVIL,MOTRIN) 800 MG tablet TAKE 1 TABLET BY MOUTH EVERY 8 HOURS AS NEEDED FOR MENSTRUAL PAIN 02/27/18  Yes Fontaine, Nadyne Coombes, MD  levocetirizine (XYZAL) 5 MG tablet Take by mouth. 02/09/16  Yes [provider]  meclizine (ANTIVERT) 25 MG tablet Take 1 tablet (25 mg total) by mouth 3 (three) times daily as needed for dizziness. 04/25/17  Yes York Spaniel, MD  montelukast (SINGULAIR) 10 MG tablet Take 1 tablet (10 mg total) by mouth at bedtime. 09/29/17  Yes Padgett, Pilar Grammes, MD  Olopatadine HCl (PAZEO) 0.7 % SOLN Place 1 drop into both eyes daily. 09/29/17  Yes Marcelyn Bruins, MD  ondansetron (ZOFRAN ODT) 4 MG disintegrating tablet 4mg  ODT q6 hours prn nausea/vomit 11/07/16  Yes Charlynne Pander, MD  Peak Flow Meter (PEAK AIR PEAK FLOW METER) DEVI Use peak flow as instructed for asthma J45.41 02/24/17  Yes Collie Siad A, MD  propranolol ER (INDERAL LA) 60 MG 24 hr capsule Take 60 mg by mouth daily. 01/27/15  Yes [provider]  topiramate (TOPAMAX) 50 MG tablet Take 1 tablet (50 mg total) by mouth 2 (two) times daily. 11/10/17  Yes York Spaniel, MD  triamcinolone cream (KENALOG) 0.1 % APPLY EXTERNALLY TO THE AFFECTED AREA TWICE DAILY 11/09/17  Yes Collie Siad A, MD  ketoconazole (NIZORAL) 2 % shampoo USE UTD ONCE A WEEK 02/28/18    [provider]   Social History   Socioeconomic History  . Marital status: Single    Spouse name: Not on file  . Number of children: 0  . Years of education: college  . Highest education level: Not on file  Occupational History  . Not on file  Social Needs  . Financial resource strain: Not on file  . Food insecurity:    Worry: Not on file    Inability: Not on file  . Transportation needs:    Medical: Not on file    Non-medical: Not on file  Tobacco Use  . Smoking status: Never Smoker  . Smokeless tobacco: Never Used  Substance and Sexual Activity  . Alcohol use: Yes    Alcohol/week: 0.0 standard drinks    Comment: Wine or mixed drink ocassionally  . Drug use: No  . Sexual activity: Never    Comment: Pt.declined sexual Hx questions  Lifestyle  . Physical activity:    Days per week: Not on file    Minutes per session: Not on file  . Stress: Not on file  Relationships  . Social connections:    Talks on phone: Not on file    Gets together: Not on file    Attends religious service: Not on file    Active member of club or organization: Not on file    Attends meetings of clubs or organizations: Not on file    Relationship status: Not on file  . Intimate partner violence:    Fear of current or ex partner: Not on file    Emotionally abused: Not on file    Physically abused: Not on file    Forced sexual activity: Not on file  Other Topics Concern  . Not on file  Social History Narrative   Patient drinks caffeine occasionally.   Patient is right handed.     Review of Systems Per HPI    Objective:   Physical Exam Vitals signs reviewed.  Constitutional:      General: She is not in acute distress.    Appearance: She is well-developed.  HENT:     Head: Normocephalic and atraumatic.  Cardiovascular:     Rate and Rhythm: Normal rate.  Pulmonary:     Effort: Pulmonary effort is normal.  Musculoskeletal:     Left knee: She exhibits abnormal meniscus  (slight discomfort with McMurray, knee flexion). She exhibits normal range of motion, no swelling, no effusion, no ecchymosis, no deformity, no erythema, no LCL laxity, normal patellar mobility and no MCL laxity. Tenderness found. Medial joint line tenderness noted.  Neurological:     Mental Status: She is alert and oriented to person, place, and time.    Vitals:   05/19/18 1555  BP: 106/70  Pulse: 84  Temp: 97.8 F (36.6 C)  TempSrc: Oral  SpO2:  98%  Weight: 262 lb 3.2 oz (118.9 kg)  Height: 5\' 7"  (1.702 m)     Dg Knee Complete 4 Views Left  Result Date: 05/19/2018 CLINICAL DATA:  Medial knee pain EXAM: LEFT KNEE - COMPLETE 4+ VIEW COMPARISON:  None. FINDINGS: No evidence of fracture, dislocation, or joint effusion. No evidence of arthropathy or other focal bone abnormality. Soft tissues are unremarkable. IMPRESSION: Negative. Electronically Signed   By: Jasmine Pang M.D.   On: 05/19/2018 17:36      Assessment & Plan:    Michele Douglas is a 28 y.o. female Acute pain of left knee - Plan: Ambulatory referral to Orthopedic Surgery, DG Knee Complete 4 Views Left, meloxicam (MOBIC) 7.5 MG tablet  -Left knee pain since January.  No known specific injury.  Locates to medial joint line.  X-ray reassuring.  Suspicious for meniscal pathology, but no limitation in range of motion, no effusion at this time.  Some mechanical symptoms.   -Options discussed including initial trial of anti-inflammatory, home range of motion, over-the-counter bracing for proprioception, MRI, and evaluation with orthopedics.  -We will start with meloxicam, continue range of motion, elevation and ice as needed at night, refer to orthopedics to decide on next step, specifically advanced imaging with MRI or trial of injection.  Alternatively I can also order MRI prior to that visit if she would prefer.    Meds ordered this encounter  Medications  . meloxicam (MOBIC) 7.5 MG tablet    Sig: Take 1-2 tablets (7.5-15 mg  total) by mouth daily.    Dispense:  30 tablet    Refill:  0   Patient Instructions   Try meloxicam instead of ibuprofen. Over the counter brace if needed, ice and elevation for now. I will refer you to Dr. Dion Saucier to decide on MRI or possible injection. Let me know if you would like to get the MRI sooner.    Acute Knee Pain, Adult Acute knee pain is sudden and may be caused by damage, swelling, or irritation of the muscles and tissues that support your knee. The injury may result from:  A fall.  An injury to your knee from twisting motions.  A hit to the knee.  Infection. Acute knee pain may go away on its own with time and rest. If it does not, your health care provider may order tests to find the cause of the pain. These may include:  Imaging tests, such as an X-ray, MRI, or ultrasound.  Joint aspiration. In this test, fluid is removed from the knee.  Arthroscopy. In this test, a lighted tube is inserted into the knee and an image is projected onto a TV screen.  Biopsy. In this test, a sample of tissue is removed from the body and studied under a microscope. Follow these instructions at home: Pay attention to any changes in your symptoms. Take these actions to relieve your pain. If you have a knee sleeve or brace:   Wear the sleeve or brace as told by your health care provider. Remove it only as told by your health care provider.  Loosen the sleeve or brace if your toes tingle, become numb, or turn cold and blue.  Keep the sleeve or brace clean.  If the sleeve or brace is not waterproof: ? Do not let it get wet. ? Cover it with a watertight covering when you take a bath or shower. Activity  Rest your knee.  Do not do things that cause pain or make  pain worse.  Avoid high-impact activities or exercises, such as running, jumping rope, or doing jumping jacks.  Work with a physical therapist to make a safe exercise program, as recommended by your health care provider.  Do exercises as told by your physical therapist. Managing pain, stiffness, and swelling   If directed, put ice on the knee: ? Put ice in a plastic bag. ? Place a towel between your skin and the bag. ? Leave the ice on for 20 minutes, 2-3 times a day.  If directed, use an elastic bandage to put pressure (compression) on your injured knee. This may control swelling, give support, and help with discomfort. General instructions  Take over-the-counter and prescription medicines only as told by your health care provider.  Raise (elevate) your knee above the level of your heart when you are sitting or lying down.  Sleep with a pillow under your knee.  Do not use any products that contain nicotine or tobacco, such as cigarettes, e-cigarettes, and chewing tobacco. These can delay healing. If you need help quitting, ask your health care provider.  If you are overweight, work with your health care provider and a dietitian to set a weight-loss goal that is healthy and reasonable for you. Extra weight can put pressure on your knee.  Keep all follow-up visits as told by your health care provider. This is important. Contact a health care provider if:  Your knee pain continues, changes, or gets worse.  You have a fever along with knee pain.  Your knee feels warm to the touch.  Your knee buckles or locks up. Get help right away if:  Your knee swells, and the swelling becomes worse.  You cannot move your knee.  You have severe pain in your knee. Summary  Acute knee pain can be caused by a fall, an injury, an infection, or damage, swelling, or irritation of the tissues that support your knee.  Your health care provider may perform tests to find out the cause of the pain.  Pay attention to any changes in your symptoms. Relieve your pain with rest, medicines, light activity, and use of ice.  Get help if your pain continues or becomes worse, your knee swells, or you cannot move your knee.  This information is not intended to replace advice given to you by your health care provider. Make sure you discuss any questions you have with your health care provider. Document Released: 12/13/2006 Document Revised: 07/28/2017 Document Reviewed: 07/28/2017 Elsevier Interactive Patient Education  [employer].    If you have lab work done today you will be contacted with your lab results within the next 2 weeks.  If you have not heard from [place] then please contact us. The fastest way to get your results is to register for My Chart.   IF you received an x-ray today, you will receive an invoice from [facility]. Please contact [facility] at [phone] with questions or concerns regarding your invoice.   IF you received labwork today, you will receive an invoice from [facility]. Please contact [facility] at [phone] with questions or concerns regarding your invoice.   Our billing staff will not be able to assist you with questions regarding Douglas from these companies.  You will be contacted with the lab results as soon as they are available. The fastest way to get your results is to activate your My Chart account. Instructions are located on the last page of this paperwork. If you have not heard from  [place] regarding the results in 2 weeks, please contact this office.       Signed,   [name], MD Primary Care at [facility].  05/19/18 6:51 PM

## 2018-05-19 NOTE — Patient Instructions (Addendum)
Try meloxicam instead of ibuprofen. Over the counter brace if needed, ice and elevation for now. I will refer you to Dr. Dion Saucier to decide on MRI or possible injection. Let me know if you would like to get the MRI sooner.    Acute Knee Pain, Adult Acute knee pain is sudden and may be caused by damage, swelling, or irritation of the muscles and tissues that support your knee. The injury may result from:  A fall.  An injury to your knee from twisting motions.  A hit to the knee.  Infection. Acute knee pain may go away on its own with time and rest. If it does not, your health care provider may order tests to find the cause of the pain. These may include:  Imaging tests, such as an X-ray, MRI, or ultrasound.  Joint aspiration. In this test, fluid is removed from the knee.  Arthroscopy. In this test, a lighted tube is inserted into the knee and an image is projected onto a TV screen.  Biopsy. In this test, a sample of tissue is removed from the body and studied under a microscope. Follow these instructions at home: Pay attention to any changes in your symptoms. Take these actions to relieve your pain. If you have a knee sleeve or brace:   Wear the sleeve or brace as told by your health care provider. Remove it only as told by your health care provider.  Loosen the sleeve or brace if your toes tingle, become numb, or turn cold and blue.  Keep the sleeve or brace clean.  If the sleeve or brace is not waterproof: ? Do not let it get wet. ? Cover it with a watertight covering when you take a bath or shower. Activity  Rest your knee.  Do not do things that cause pain or make pain worse.  Avoid high-impact activities or exercises, such as running, jumping rope, or doing jumping jacks.  Work with a physical therapist to make a safe exercise program, as recommended by your health care provider. Do exercises as told by your physical therapist. Managing pain, stiffness, and  swelling   If directed, put ice on the knee: ? Put ice in a plastic bag. ? Place a towel between your skin and the bag. ? Leave the ice on for 20 minutes, 2-3 times a day.  If directed, use an elastic bandage to put pressure (compression) on your injured knee. This may control swelling, give support, and help with discomfort. General instructions  Take over-the-counter and prescription medicines only as told by your health care provider.  Raise (elevate) your knee above the level of your heart when you are sitting or lying down.  Sleep with a pillow under your knee.  Do not use any products that contain nicotine or tobacco, such as cigarettes, e-cigarettes, and chewing tobacco. These can delay healing. If you need help quitting, ask your health care provider.  If you are overweight, work with your health care provider and a dietitian to set a weight-loss goal that is healthy and reasonable for you. Extra weight can put pressure on your knee.  Keep all follow-up visits as told by your health care provider. This is important. Contact a health care provider if:  Your knee pain continues, changes, or gets worse.  You have a fever along with knee pain.  Your knee feels warm to the touch.  Your knee buckles or locks up. Get help right away if:  Your knee swells, and  the swelling becomes worse.  You cannot move your knee.  You have severe pain in your knee. Summary  Acute knee pain can be caused by a fall, an injury, an infection, or damage, swelling, or irritation of the tissues that support your knee.  Your health care provider may perform tests to find out the cause of the pain.  Pay attention to any changes in your symptoms. Relieve your pain with rest, medicines, light activity, and use of ice.  Get help if your pain continues or becomes worse, your knee swells, or you cannot move your knee. This information is not intended to replace advice given to you by your health  care provider. Make sure you discuss any questions you have with your health care provider. Document Released: 12/13/2006 Document Revised: 07/28/2017 Document Reviewed: 07/28/2017 Elsevier Interactive Patient Education  [employer].    If you have lab work done today you will be contacted with your lab results within the next 2 weeks.  If you have not heard from [place] then please contact us. The fastest way to get your results is to register for My Chart.   IF you received an x-ray today, you will receive an invoice from [facility]. Please contact [facility] at [phone] with questions or concerns regarding your invoice.   IF you received labwork today, you will receive an invoice from [facility]. Please contact [facility] at [phone] with questions or concerns regarding your invoice.   Our billing staff will not be able to assist you with questions regarding bills from these companies.  You will be contacted with the lab results as soon as they are available. The fastest way to get your results is to activate your My Chart account. Instructions are located on the last page of this paperwork. If you have not heard from [place] regarding the results in 2 weeks, please contact this office.

## 2018-07-20 ENCOUNTER — Other Ambulatory Visit: Payer: Self-pay | Admitting: Neurology

## 2018-07-20 ENCOUNTER — Telehealth: Payer: Self-pay

## 2018-07-20 NOTE — Telephone Encounter (Signed)
-----   Message from York Spaniel, MD sent at 07/20/2018 10:45 AM EDT ----- This patient will need a revisit at some point, nonurgent, may see nurse practitioner.

## 2018-07-20 NOTE — Telephone Encounter (Signed)
Reached out to the pt via my chart requesting her to call our office to schedule f/u visit.  Pt was given office # is my chart message. Please schedule with Dr. Anne Hahn or Lindley Magnus, NP.

## 2018-08-03 ENCOUNTER — Ambulatory Visit: Payer: BC Managed Care – PPO | Admitting: Allergy

## 2018-09-01 ENCOUNTER — Other Ambulatory Visit: Payer: Self-pay | Admitting: Neurology

## 2018-09-05 ENCOUNTER — Other Ambulatory Visit: Payer: Self-pay

## 2018-09-06 ENCOUNTER — Ambulatory Visit: Payer: BC Managed Care – PPO | Admitting: Gynecology

## 2018-09-06 ENCOUNTER — Encounter: Payer: Self-pay | Admitting: Gynecology

## 2018-09-06 VITALS — BP 124/82 | Ht 67.0 in | Wt 261.0 lb

## 2018-09-06 DIAGNOSIS — Z01419 Encounter for gynecological examination (general) (routine) without abnormal findings: Secondary | ICD-10-CM | POA: Diagnosis not present

## 2018-09-06 DIAGNOSIS — Z13220 Encounter for screening for lipoid disorders: Secondary | ICD-10-CM

## 2018-09-06 NOTE — Progress Notes (Signed)
    Yaslin Kirtley 03/22/1990 [id number]        28 y.o.  G0P0 for annual gynecologic exam.  Without gynecologic complaints.  Past medical history,surgical history, problem list, medications, allergies, family history and social history were all reviewed and documented as reviewed in the EPIC chart.  ROS:  Performed with pertinent positives and negatives included in the history, assessment and plan.   Additional significant findings : None   Exam: Caryn Bee assistant Vitals:   09/06/18 1224  BP: 124/82  Weight: 261 lb (118.4 kg)  Height: 5\' 7"  (1.702 m)   Body mass index is 40.88 kg/m.  General appearance:  Normal affect, orientation and appearance. Skin: Grossly normal HEENT: Without gross lesions.  No cervical or supraclavicular adenopathy. Thyroid normal.  Lungs:  Clear without wheezing, rales or rhonchi Cardiac: RR, without RMG Abdominal:  Soft, nontender, without masses, guarding, rebound, organomegaly or hernia Breasts:  Examined lying and sitting without masses, retractions, discharge or axillary adenopathy. Pelvic:  Ext, BUS, Vagina: Normal  Cervix: Normal  Uterus: Anteverted, normal size, shape and contour, midline and mobile nontender   Adnexa: Without masses or tenderness    Anus and perineum: Normal   Rectovaginal: Normal sphincter tone without palpated masses or tenderness.    Assessment/Plan:  28 y.o. G0P0 female for annual gynecologic exam.  With regular menses, not sexually active  1. Contraception is not an issue.  Patient knows to call if it becomes an issue to discuss contraceptive options. 2. Pap smear 2018.  No Pap smear done today.  History LGSIL 2015.  Colposcopy with biopsy showed LGSIL with negative ECC.  Follow-up Pap smear/HPV 2016 was negative.  Pap smears in 2017 and 2018 were negative.  Plan follow-up Pap smear next year at 3-year interval. 3. Breast health.  Breast exam normal today.  SBE monthly reviewed.  Plan screening mammography at age  24. 78. Health maintenance.  Requests baseline labs.  CBC, CMP and lipid profile ordered.  Follow-up 1 year, sooner as needed.   Anastasio Auerbach MD, 12:44 PM 09/06/2018

## 2018-09-06 NOTE — Patient Instructions (Signed)
Follow-up in 1 year for annual exam, sooner if any issues. 

## 2018-09-07 LAB — LIPID PANEL
Cholesterol: 126 mg/dL (ref <200)
HDL: 43 mg/dL — ABNORMAL LOW (ref >=50)
LDL Cholesterol (Calc): 66 mg/dL (calc)
Non-HDL Cholesterol (Calc): 83 mg/dL (calc) (ref <130)
Total CHOL/HDL Ratio: 2.9 (calc) (ref <5.0)
Triglycerides: 88 mg/dL (ref <150)

## 2018-09-07 LAB — CBC WITH DIFFERENTIAL/PLATELET
Absolute Monocytes: 600 cells/uL (ref 200–950)
Basophils Absolute: 72 cells/uL (ref 0–200)
Basophils Relative: 0.9 %
Eosinophils Absolute: 192 cells/uL (ref 15–500)
Eosinophils Relative: 2.4 %
HCT: 35.9 % (ref 35.0–45.0)
Hemoglobin: 12.1 g/dL (ref 11.7–15.5)
Lymphs Abs: 2912 cells/uL (ref 850–3900)
MCH: 29.3 pg (ref 27.0–33.0)
MCHC: 33.7 g/dL (ref 32.0–36.0)
MCV: 86.9 fL (ref 80.0–100.0)
MPV: 10.8 fL (ref 7.5–12.5)
Monocytes Relative: 7.5 %
Neutro Abs: 4224 cells/uL (ref 1500–7800)
Neutrophils Relative %: 52.8 %
Platelets: 392 10*3/uL (ref 140–400)
RBC: 4.13 10*6/uL (ref 3.80–5.10)
RDW: 12.9 % (ref 11.0–15.0)
Total Lymphocyte: 36.4 %
WBC: 8 10*3/uL (ref 3.8–10.8)

## 2018-09-07 LAB — COMPREHENSIVE METABOLIC PANEL
AG Ratio: 1.6 (calc) (ref 1.0–2.5)
ALT: 11 U/L (ref 6–29)
AST: 15 U/L (ref 10–30)
Albumin: 4.2 g/dL (ref 3.6–5.1)
Alkaline phosphatase (APISO): 63 U/L (ref 31–125)
BUN: 10 mg/dL (ref 7–25)
CO2: 18 mmol/L — ABNORMAL LOW (ref 20–32)
Calcium: 9.4 mg/dL (ref 8.6–10.2)
Chloride: 107 mmol/L (ref 98–110)
Creat: 0.83 mg/dL (ref 0.50–1.10)
Globulin: 2.6 g/dL (calc) (ref 1.9–3.7)
Glucose, Bld: 94 mg/dL (ref 65–99)
Potassium: 4.2 mmol/L (ref 3.5–5.3)
Sodium: 137 mmol/L (ref 135–146)
Total Bilirubin: 0.6 mg/dL (ref 0.2–1.2)
Total Protein: 6.8 g/dL (ref 6.1–8.1)

## 2018-09-13 IMAGING — DX DG CHEST 2V
2 series · 2 of 2 positions shown · non-contrast
Comparison: Chest x-ray of November 20, 2013

CLINICAL DATA: Right lower posterior back pain with cough for about
10 days.

EXAM:
CHEST  2 VIEW

[chest pa]
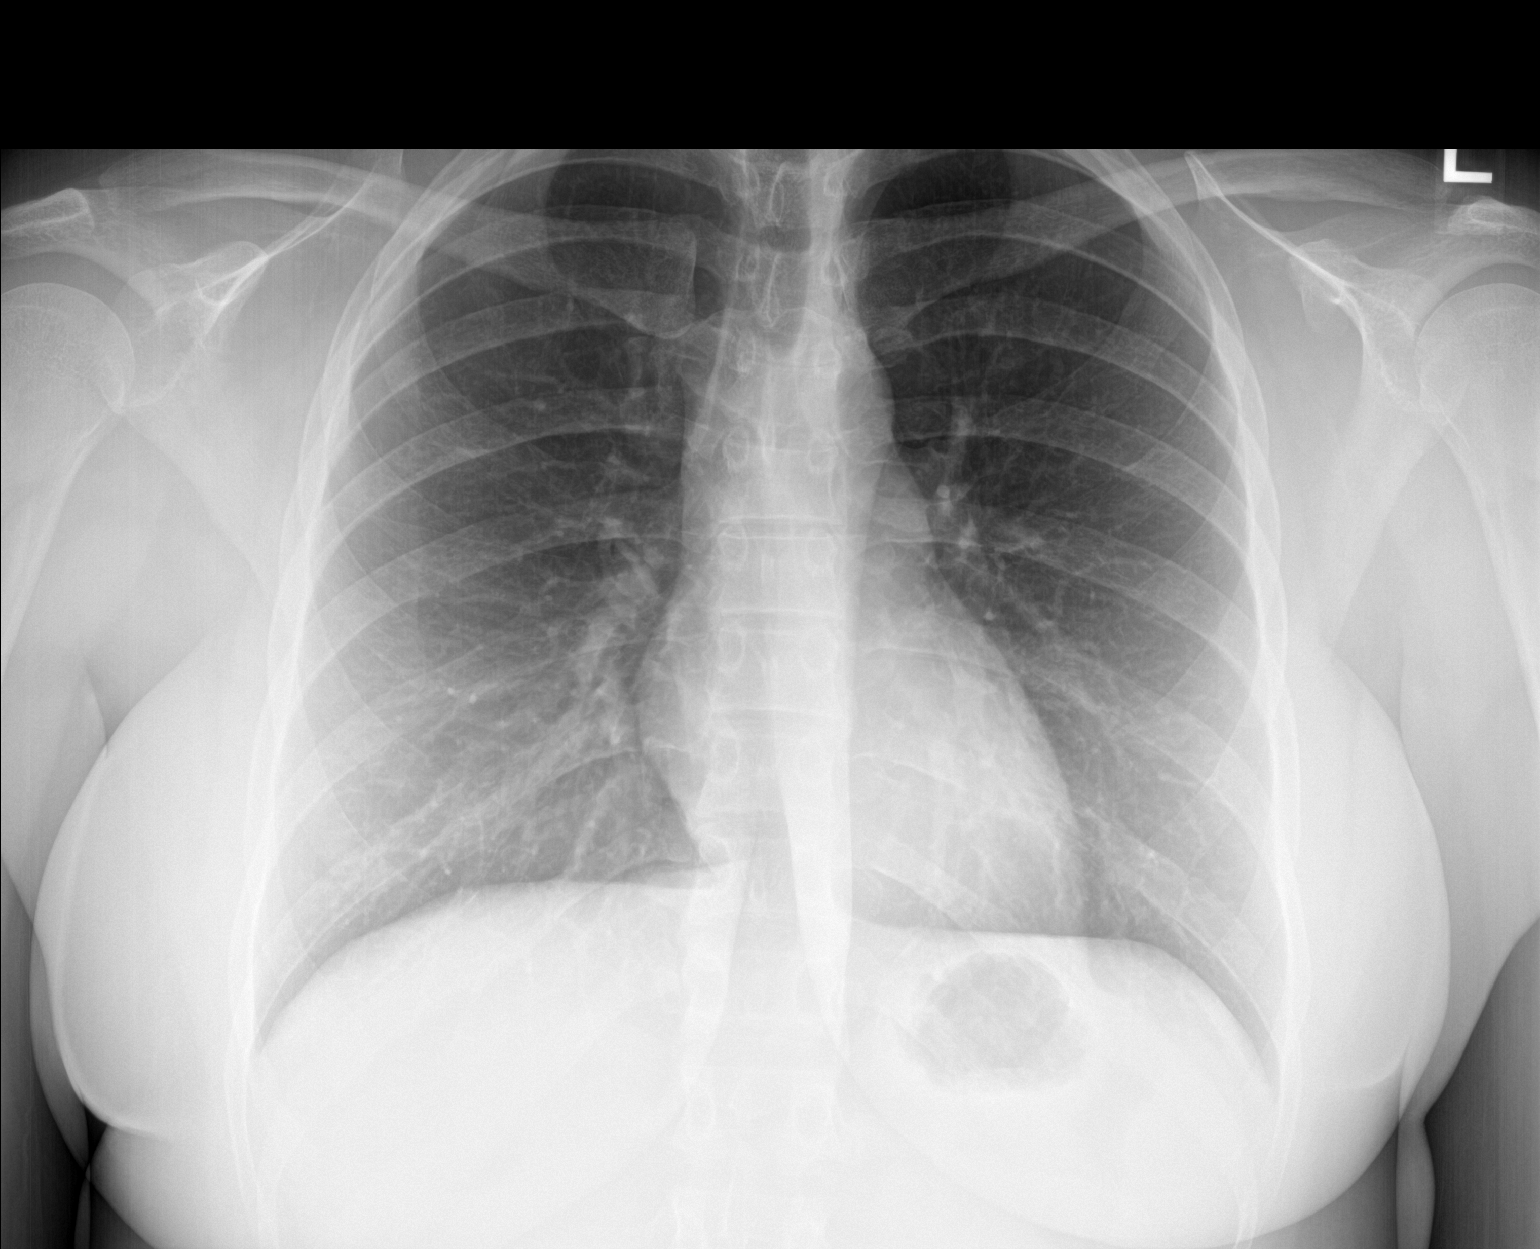

[chest lat]
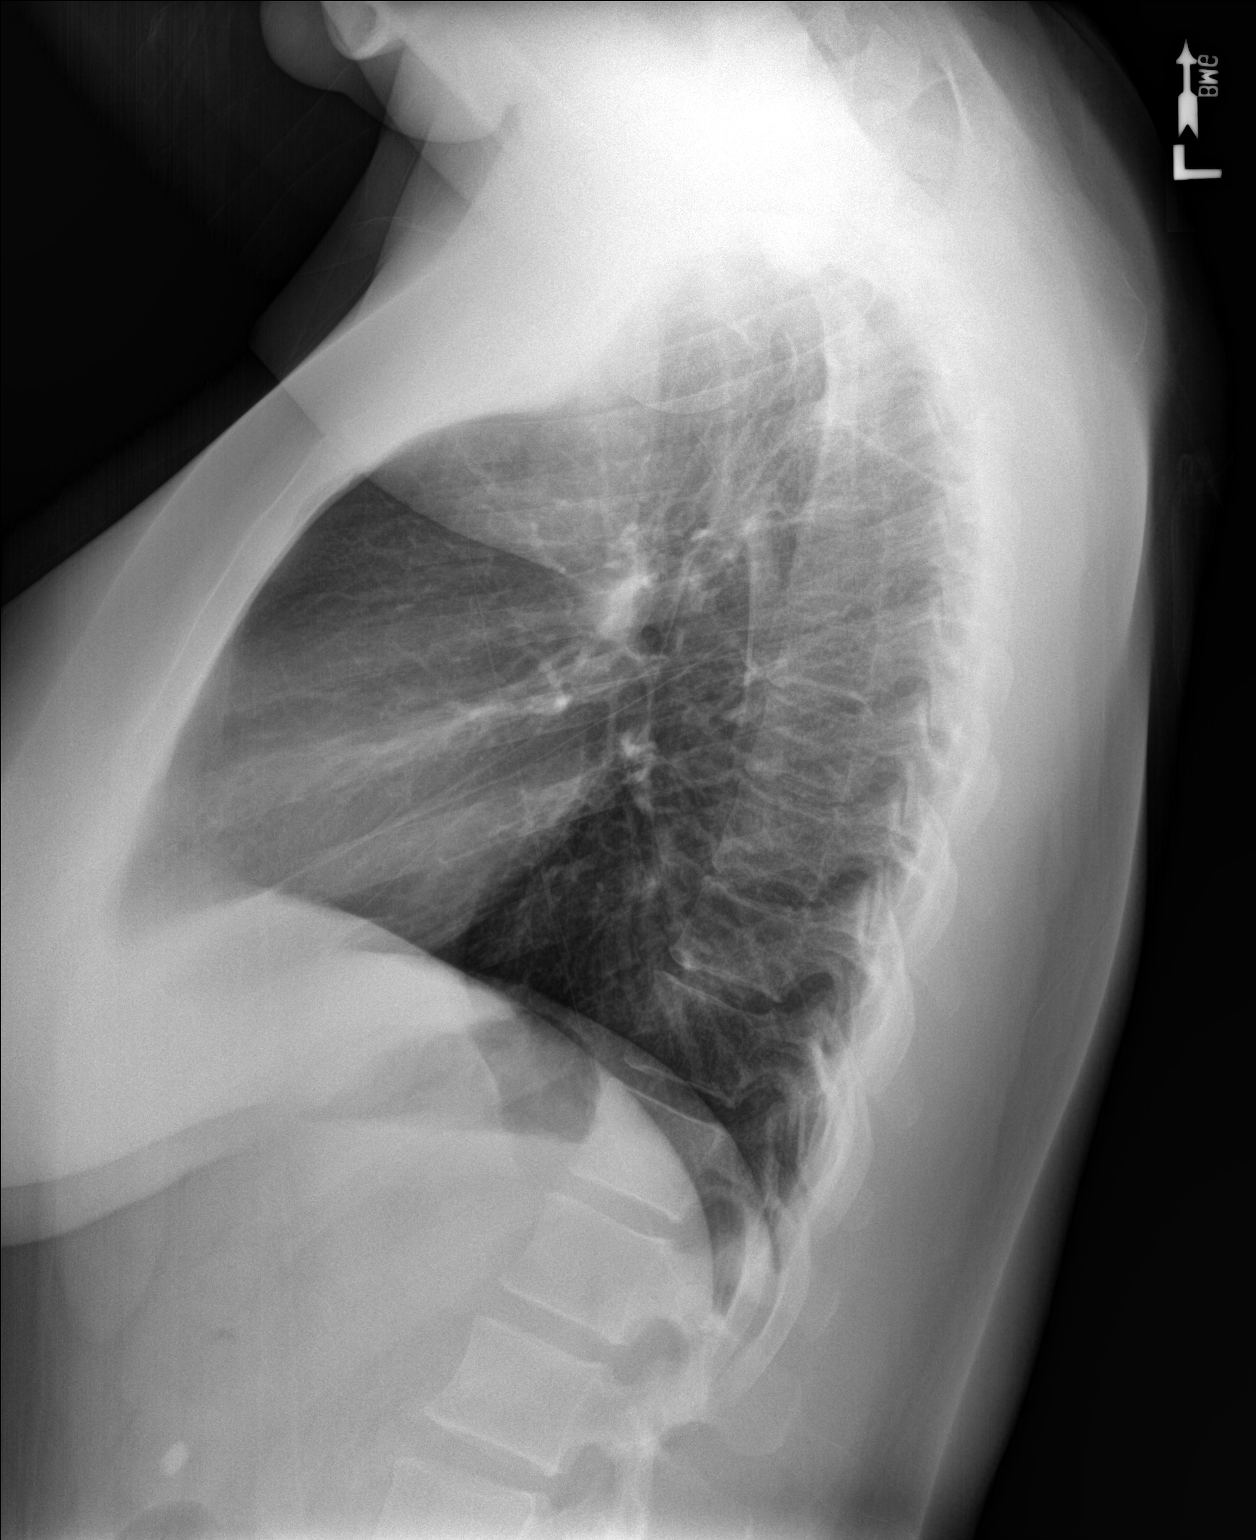

[2 of 2 positions shown; findings below may reference images not displayed]

FINDINGS: The lungs are adequately inflated and clear. The heart and pulmonary
vascularity are normal. The mediastinum is normal in width. The
trachea is midline. The bony thorax is unremarkable.
IMPRESSION: There is no pneumonia nor other acute cardiopulmonary abnormality.

Incidental note is made of a calcification in the upper abdomen on
the lateral view only which may reflect a kidney stone or gallstone.
Evaluation of the gallbladder and kidneys with ultrasound would be
useful.

## 2018-10-02 ENCOUNTER — Other Ambulatory Visit: Payer: Self-pay

## 2018-10-02 ENCOUNTER — Encounter: Payer: Self-pay | Admitting: Gynecology

## 2018-10-02 ENCOUNTER — Ambulatory Visit: Payer: BC Managed Care – PPO | Admitting: Gynecology

## 2018-10-02 VITALS — BP 124/80

## 2018-10-02 DIAGNOSIS — N926 Irregular menstruation, unspecified: Secondary | ICD-10-CM

## 2018-10-02 NOTE — Progress Notes (Signed)
    Sherrye Puga 1990-09-04 [id number]        28 y.o.  G0P0 presents having regular monthly menses with last normal menstrual period 09/19/2018.  Lasted 5 days.  Then started bleeding again 7/31 for 2 days which is unusual for her.  No pain or other associated symptoms.  Has been exercising every day for the last month which is unusual for her.  Remains not sexually active.  Past medical history,surgical history, problem list, medications, allergies, family history and social history were all reviewed and documented in the EPIC chart.  Directed ROS with pertinent positives and negatives documented in the history of present illness/assessment and plan.  Exam: Caryn Bee assistant Vitals:   10/02/18 1603  BP: 124/80   General appearance:  Normal Abdomen soft nontender without masses guarding rebound Pelvic external BUS vagina normal.  Cervix normal.  Uterus grossly normal midline mobile nontender.  Adnexa without masses or tenderness.  Assessment/Plan:  28 y.o. G0P0 with episode of irregular bleeding x1.  Pregnancy not a possibility per patient.  Differential reviewed with the patient to include ovulatory irregularity, hormonal dysfunction such as thyroid, structural such as polyps or fibroids.  Given this was an isolated first-time event recommended observation for now.  If becomes recurrent then she knows to call and we will pursue a more involved evaluation to include blood work and sonohysterogram.  Patient agrees with the plan and is comfortable with this approach.    Anastasio Auerbach MD, 4:21 PM 10/02/2018

## 2018-10-02 NOTE — Patient Instructions (Signed)
Call if irregular bleeding recurs

## 2018-11-09 ENCOUNTER — Telehealth: Payer: Self-pay | Admitting: Neurology

## 2018-11-09 ENCOUNTER — Other Ambulatory Visit: Payer: Self-pay | Admitting: Neurology

## 2018-11-09 MED ORDER — TOPIRAMATE 50 MG PO TABS: 50.0000 mg | ORAL_TABLET | Freq: Two times a day (BID) | ORAL | 0 refills | Status: DC

## 2018-11-09 NOTE — Addendum Note (Signed)
Addended by: Verlin Grills T on: 11/09/2018 10:39 AM   Modules accepted: Orders

## 2018-11-09 NOTE — Telephone Encounter (Signed)
Pt has scheduled her f/u for 09-14 with NP Judson Roch, pt is asking if she can get a refill to hold her until the appointment topiramate (TOPAMAX) 50 MG tablet [place] [phone]  Pt took last pill on this morning

## 2018-11-09 NOTE — Telephone Encounter (Signed)
I reached out to the pt and advised refill has been sent pending her appt on 11/13/2018. Pt verbalized appreciation for the call.

## 2018-11-12 NOTE — Progress Notes (Addendum)
PATIENT: Michele Douglas DOB: 24-Apr-1990  REASON FOR VISIT: follow up HISTORY FROM: patient  HISTORY OF PRESENT ILLNESS: Today 11/13/18  [name] is a 28 year old female with history of pseudotumor cerebri and headache.  She was last seen in August 2019, at that time she was to taper off Topamax.  She decided to continue on the medication and is currently taking Topamax 50 mg daily.  She denies any headache or visual disturbance.  She says she had a recent eye exam in the last several months with Dr. Elmer PickerHecker and there was no mention of papilledema.  She did get new glasses at the time.  She says since June, she has been working out daily and has changed her eating habits.  She has lost 9 pounds, but feels she should have had a more significant weight loss.  She does report when she gets off an airplane, she will have headache and dizziness.  She has [facility] for essential tremor and has been prescribed propanolol.  The essential tremor is well managed with propanolol, she is asking us to take over this medication.  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  She had lab work done in July 2020, CBC and CMP that was unremarkable.  She presents today for follow-up unaccompanied.  HISTORY 09/29/2017 Dr. Anne HahnWillis: [name] is a 28 year old right-handed black female with a history of pseudotumor cerebri and headache.  The patient has been on Topamax currently on 50 mg twice daily.  The patient has been seen by the office of Dr. Elmer PickerHecker, she was told that there was no significant evidence of papilledema.  The patient has done quite well, she is not having any headaches whatsoever.  She does report occasional episodes of vertigo and has to take meclizine for this.  The patient returns to this office for an evaluation.  Overall, she is doing quite well.  At night with driving, the patient indicates that she has troubles with the lights, she may occasionally have some  double vision.  She does not have problems during the day.   REVIEW OF SYSTEMS: Out of a complete 14 system review of symptoms, the patient complains only of the following symptoms, and all other reviewed systems are negative.  None  ALLERGIES: Allergies  Allergen Reactions   Lactose Other (See Comments)    Nausea, vomiting, diarrhea.    Diamox [Acetazolamide] Other (See Comments)    Tingling in fingers and lips    HOME MEDICATIONS: Outpatient Medications Prior to Visit  Medication Sig Dispense Refill   albuterol (VENTOLIN HFA) 108 (90 Base) MCG/ACT inhaler INHALE 1-2 PUFFS INTO THE LUNGS EVERY 4 HOURS AS NEEDED 18 g 1   azelastine (ASTELIN) 0.1 % nasal spray Place 2 sprays into both nostrils 2 (two) times daily. (Patient taking differently: Place 2 sprays into both nostrils as needed. ) 30 mL 5   beclomethasone (QVAR) 40 MCG/ACT inhaler Inhale into the lungs.     fluticasone (FLONASE) 50 MCG/ACT nasal spray SHAKE LIQUID AND USE 2 SPRAYS IN EACH NOSTRIL DAILY (Patient taking differently: Place 2 sprays into both nostrils as needed. ) 50 g 1   ibuprofen (ADVIL,MOTRIN) 800 MG tablet TAKE 1 TABLET BY MOUTH EVERY 8 HOURS AS NEEDED FOR MENSTRUAL PAIN 60 tablet 1   ketoconazole (NIZORAL) 2 % shampoo USE UTD ONCE A WEEK     levocetirizine (XYZAL) 5 MG tablet Take 5 mg by mouth as needed.  meclizine (ANTIVERT) 25 MG tablet Take 1 tablet (25 mg total) by mouth 3 (three) times daily as needed for dizziness. 30 tablet 1   meloxicam (MOBIC) 7.5 MG tablet Take 1-2 tablets (7.5-15 mg total) by mouth daily. (Patient taking differently: Take 7.5-15 mg by mouth as needed. ) 30 tablet 0   montelukast (SINGULAIR) 10 MG tablet Take 1 tablet (10 mg total) by mouth at bedtime. (Patient taking differently: Take 10 mg by mouth as needed. ) 30 tablet 5   Olopatadine HCl (PAZEO) 0.7 % SOLN Place 1 drop into both eyes daily. (Patient taking differently: Place 1 drop into both eyes as needed. ) 1  Bottle 5   ondansetron (ZOFRAN ODT) 4 MG disintegrating tablet 4mg  ODT q6 hours prn nausea/vomit (Patient taking differently: Take 4 mg by mouth as needed. ) 10 tablet 0   ondansetron (ZOFRAN) 4 MG tablet TAKE 1 TABLET(4 MG) BY MOUTH EVERY 8 HOURS AS NEEDED FOR NAUSEA OR VOMITING (Patient taking differently: Take 4 mg by mouth as needed. ) 30 tablet 0   Peak Flow Meter (PEAK AIR PEAK FLOW METER) DEVI Use peak flow as instructed for asthma J45.41 1 each 0   propranolol ER (INDERAL LA) 60 MG 24 hr capsule Take 60 mg by mouth daily.  2   topiramate (TOPAMAX) 50 MG tablet Take 1 tablet (50 mg total) by mouth 2 (two) times daily. 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   LGSIL on colposcopic biopsy, ECC negative   Pseudotumor cerebri    Tremor     PAST SURGICAL HISTORY: Past Surgical History:  Procedure Laterality Date   BREAST SURGERY  2006   cystectomy   TYMPANOPLASTY     left x 2    FAMILY HISTORY: Family History  Problem Relation Age of Onset   Hypertension Mother    Migraines Mother    AAA (abdominal aortic aneurysm) Father    Migraines Sister    Myasthenia gravis Sister     SOCIAL HISTORY: Social History   Socioeconomic History   Marital status: Single    Spouse name: Not on file   Number of children: 0   Years of education: college   Highest education level: Not on file  Occupational History   Not on file  Social Needs   Financial resource strain: Not on file    Food insecurity    Worry: Not on file    Inability: Not on file   Transportation needs    Medical: Not on file    Non-medical: Not on file  Tobacco Use   Smoking status: Never Smoker   Smokeless tobacco: Never Used  Substance and Sexual Activity   Alcohol use: Yes    Alcohol/week: 0.0 standard drinks    Comment: Wine or mixed drink ocassionally   Drug use: No   Sexual activity: Never    Comment: Pt.declined sexual Hx questions  Lifestyle   Physical activity    Days per week: Not  on file    Minutes per session: Not on file   Stress: Not on file  Relationships   Social connections    Talks on phone: Not on file    Gets together: Not on file    Attends religious service: Not on file    Active member of club or organization: Not on file    Attends meetings of clubs or organizations: Not on file    Relationship status: Not on file   Intimate partner violence    Fear of current or ex partner: Not on file    Emotionally abused: Not on file    Physically abused: Not on file    Forced sexual activity: Not on file  Other Topics Concern   Not on file  Social History Narrative   Patient drinks caffeine occasionally.   Patient is right handed.       PHYSICAL EXAM  Vitals:   11/13/18 0756  BP: 116/60  Pulse: 70  Temp: 97.8 F (36.6 C)  TempSrc: Oral  Weight: 256 lb 9.6 oz (116.4 kg)  Height: 5\' 7"  (1.702 m)   Body mass index is 40.19 kg/m.  Generalized: Well developed, in no acute distress   Neurological examination  Mentation: Alert oriented to time, place, history taking. Follows all commands speech and language fluent Cranial nerve II-XII: Pupils were equal round reactive to light. Extraocular movements were full, visual field were full on confrontational test. Facial sensation and strength were normal.  Head turning and shoulder shrug  were normal and symmetric. Motor: The motor testing reveals 5 over 5 strength of all 4 extremities. Good symmetric  motor tone is noted throughout.  Sensory: Sensory testing is intact to soft touch on all 4 extremities. No evidence of extinction is noted.  Coordination: Cerebellar testing reveals good finger-nose-finger and heel-to-shin bilaterally.  Gait and station: Gait is normal. Tandem gait is normal. Romberg is negative. No drift is seen.  Reflexes: Deep tendon reflexes are symmetric and normal bilaterally.   DIAGNOSTIC DATA (LABS, IMAGING, TESTING) - I reviewed patient records, labs, notes, testing and imaging myself where available.  Lab Results  Component Value Date   WBC 8.0 09/06/2018   HGB 12.1 09/06/2018   HCT 35.9 09/06/2018   MCV 86.9 09/06/2018   PLT 392 09/06/2018      Component Value Date/Time   NA 137 09/06/2018 1250   NA 140 12/09/2017 1049   K 4.2 09/06/2018 1250   CL 107 09/06/2018 1250   CO2 18 (L) 09/06/2018 1250   GLUCOSE 94 09/06/2018 1250   BUN 10 09/06/2018 1250   BUN 8 12/09/2017 1049   CREATININE 0.83 09/06/2018 1250   CALCIUM 9.4 09/06/2018 1250   PROT 6.8 09/06/2018 1250   PROT 6.8 12/09/2017 1049   ALBUMIN 4.2 12/09/2017 1049   AST 15 09/06/2018 1250   ALT 11 09/06/2018 1250   ALKPHOS 73 12/09/2017 1049   BILITOT 0.6 09/06/2018 1250   BILITOT 0.7 12/09/2017 1049   GFRNONAA 94 12/09/2017 1049   GFRAA 108 12/09/2017 1049   Lab Results  Component Value Date   CHOL 126 09/06/2018   HDL 43 (L) 09/06/2018   LDLCALC 66 09/06/2018   TRIG 88 09/06/2018   CHOLHDL 2.9 09/06/2018   Lab Results  Component Value Date   HGBA1C 5.3 12/09/2017   No results found for: [id number] Lab Results  Component Value Date   TSH 1.310 07/20/2017    ASSESSMENT AND PLAN 28 y.o. year old female  has a past medical history of Asthma, Essential tremor, Lactose intolerance, LGSIL (low grade squamous intraepithelial dysplasia) (07/2013), Pseudotumor cerebri, and Tremor. here with:  1.  Pseudotumor cerebri 2.  Headache 3.  Obesity, BMI 40.19  She denies any headache or  visual disturbance.  She will remain on Topamax 50 mg daily.  In the past she has discussed coming off the medication with Dr. Anne Hahn, but she has decided to continue on.  I will get the report from her most recent eye visit with Dr. Elmer Picker in regard to evidence of papilledema.  She has been working out daily and has changed her eating habits since June 2020, and has resulted in a 9 pound weight loss.  I will place a referral for the healthy weight and wellness center.  I have encouraged her to continue follow-up with her primary care doctor.  She has been seeing a neurologist at [facility] for essential tremor, prescribed propanolol.  The essential tremor in her hands is well managed with medication, it seems reasonable to assume the prescription, and I will refill in the future (no tremor was noted on exam).  She will follow-up in 6 months or sooner if needed.  I did advise if her symptoms worsen or she develops any new symptoms she should let us know.  Addendum 11/13/2018 SS: I received report from most recent office visit April 18, 2018 with Dr. Alben Spittle at [facility] ophthalmology, no optic disc edema was seen at visit.  I spent 25 minutes with the patient. 50% of this time was spent discussing her plan of care.  Margie Ege, AGNP-C, DNP 11/13/2018, 8:04 AM [facility] [address] [place], [place] [address] [phone]

## 2018-11-13 ENCOUNTER — Encounter: Payer: Self-pay | Admitting: Neurology

## 2018-11-13 ENCOUNTER — Ambulatory Visit: Payer: BC Managed Care – PPO | Admitting: Neurology

## 2018-11-13 ENCOUNTER — Other Ambulatory Visit: Payer: Self-pay

## 2018-11-13 ENCOUNTER — Telehealth: Payer: Self-pay | Admitting: Neurology

## 2018-11-13 VITALS — BP 116/60 | HR 70 | Temp 97.8°F | Ht 67.0 in | Wt 256.6 lb

## 2018-11-13 DIAGNOSIS — E669 Obesity, unspecified: Secondary | ICD-10-CM | POA: Insufficient documentation

## 2018-11-13 DIAGNOSIS — Z6841 Body Mass Index (BMI) 40.0 and over, adult: Secondary | ICD-10-CM

## 2018-11-13 DIAGNOSIS — E66812 Obesity, class 2: Secondary | ICD-10-CM | POA: Insufficient documentation

## 2018-11-13 DIAGNOSIS — G932 Benign intracranial hypertension: Secondary | ICD-10-CM | POA: Diagnosis not present

## 2018-11-13 MED ORDER — TOPIRAMATE 50 MG PO TABS: 50.0000 mg | ORAL_TABLET | Freq: Every day | ORAL | 1 refills | Status: DC

## 2018-11-13 NOTE — Telephone Encounter (Signed)
Contacted Dr. Herbert Deaner office [phone] for most recent eye exam.  They are to fax today for our review.

## 2018-11-13 NOTE — Telephone Encounter (Signed)
Please contact Dr. Herbert Deaner eye doctor in [place] to get most recent office visit report, specifically in regards to papilledema for pseudotumor cerebri.

## 2018-11-13 NOTE — Patient Instructions (Signed)
1. Continue topamax 50 mg daily 2. I will get most recent eye exam 3. F/u in 6 months 4. I will fill propranolol in the future 5. Referral to weight loss center

## 2018-11-13 NOTE — Progress Notes (Signed)
I have read the note, and I agree with the clinical assessment and plan.  Cray Monnin K Ardena Gangl   

## 2018-11-20 ENCOUNTER — Encounter: Payer: Self-pay | Admitting: Gynecology

## 2018-11-29 ENCOUNTER — Other Ambulatory Visit: Payer: Self-pay

## 2018-11-29 ENCOUNTER — Encounter: Payer: Self-pay | Admitting: Allergy

## 2018-11-29 ENCOUNTER — Ambulatory Visit: Payer: BC Managed Care – PPO | Admitting: Allergy

## 2018-11-29 VITALS — BP 132/90 | HR 79 | Temp 97.4°F | Resp 16 | Ht 66.0 in | Wt 254.4 lb

## 2018-11-29 DIAGNOSIS — Z9109 Other allergy status, other than to drugs and biological substances: Secondary | ICD-10-CM

## 2018-11-29 DIAGNOSIS — H1013 Acute atopic conjunctivitis, bilateral: Secondary | ICD-10-CM

## 2018-11-29 DIAGNOSIS — J3089 Other allergic rhinitis: Secondary | ICD-10-CM | POA: Diagnosis not present

## 2018-11-29 DIAGNOSIS — L2089 Other atopic dermatitis: Secondary | ICD-10-CM

## 2018-11-29 DIAGNOSIS — J4541 Moderate persistent asthma with (acute) exacerbation: Secondary | ICD-10-CM | POA: Diagnosis not present

## 2018-11-29 MED ORDER — MONTELUKAST SODIUM 10 MG PO TABS: 10.0000 mg | ORAL_TABLET | Freq: Every day | ORAL | 5 refills | Status: AC

## 2018-11-29 MED ORDER — QVAR REDIHALER 80 MCG/ACT IN AERB: 2.0000 | INHALATION_SPRAY | Freq: Two times a day (BID) | RESPIRATORY_TRACT | 5 refills | Status: DC

## 2018-11-29 MED ORDER — ALBUTEROL SULFATE HFA 108 (90 BASE) MCG/ACT IN AERS: INHALATION_SPRAY | RESPIRATORY_TRACT | 1 refills | Status: DC

## 2018-11-29 MED ORDER — LEVOCETIRIZINE DIHYDROCHLORIDE 5 MG PO TABS: 5.0000 mg | ORAL_TABLET | ORAL | 5 refills | Status: AC | PRN

## 2018-11-29 NOTE — Progress Notes (Signed)
Follow-up Note  RE: Michele Douglas MRN: [id number] DOB: 04-26-1990 Date of Office Visit: 11/29/2018   History of present illness: Michele Douglas is a 28 y.o. female presenting today for follow-up of allergic rhinitis with conjunctivitis, asthma, atopic dermatitis, nickel allergy.  She was last seen in the office on 02/16/18 by myself.    She states that since her co-workers have returned to the office as of last week many of them are using spray disinfectants in the air as well using oil diffusers with essential oils.  She states these fumes are triggering her asthma symptoms.  She is having increased cough, chest tightness and needed to use her albuterol inhaler.   She also states the past 2 days she has had nasal congestion that is worsening as well as tenderness of her left nostril.   She is using Qvar 2 puffs daily at this time as she is running out and was trying to stretch it to get to this appt.  She is taking singulair daily.  She is taking Xyzal daily.  She is doing nasal saline rinses couple times a week.  She is using flonase and astelin at this time.    She states that she does get eczema flare on her neck about once a month that is improved with triacinolone use.    She has had her flu vaccine this season already.    Review of systems: Review of Systems  Constitutional: Negative for chills, fever and malaise/fatigue.  HENT: Positive for congestion. Negative for ear discharge, ear pain, nosebleeds, sinus pain and sore throat.   Eyes: Negative for pain, discharge and redness.  Respiratory: Positive for cough and shortness of breath.   Cardiovascular: Negative for chest pain.  Gastrointestinal: Negative for abdominal pain, constipation, diarrhea, heartburn, nausea and vomiting.  Musculoskeletal: Negative for joint pain.  Skin: Negative for itching and rash.  Neurological: Negative for headaches.    All other systems negative unless noted above in HPI  Past  medical/social/surgical/family history have been reviewed and are unchanged unless specifically indicated below.  No changes  Medication List: Allergies as of 11/29/2018      Reactions   Lactose Other (See Comments)   Nausea, vomiting, diarrhea.    Diamox [acetazolamide] Other (See Comments)   Tingling in fingers and lips      Medication List       Accurate as of November 29, 2018  5:31 PM. If you have any questions, ask your nurse or doctor.        STOP taking these medications   ondansetron 4 MG disintegrating tablet Commonly known as: Zofran ODT Stopped by: Michele Reaney Larose Hires, MD     TAKE these medications   albuterol 108 (90 Base) MCG/ACT inhaler Commonly known as: Ventolin HFA INHALE 1-2 PUFFS INTO THE LUNGS EVERY 4 HOURS AS NEEDED   azelastine 0.1 % nasal spray Commonly known as: ASTELIN Place 2 sprays into both nostrils 2 (two) times daily. What changed:   when to take this  reasons to take this   fluticasone 50 MCG/ACT nasal spray Commonly known as: FLONASE SHAKE LIQUID AND USE 2 SPRAYS IN EACH NOSTRIL DAILY What changed:   how much to take  how to take this  when to take this  reasons to take this  additional instructions   ibuprofen 800 MG tablet Commonly known as: ADVIL TAKE 1 TABLET BY MOUTH EVERY 8 HOURS AS NEEDED FOR MENSTRUAL PAIN   ketoconazole 2 % shampoo  Commonly known as: NIZORAL USE UTD ONCE A WEEK   levocetirizine 5 MG tablet Commonly known as: XYZAL Take 5 mg by mouth as needed.   meclizine 25 MG tablet Commonly known as: ANTIVERT Take 1 tablet (25 mg total) by mouth 3 (three) times daily as needed for dizziness.   meloxicam 7.5 MG tablet Commonly known as: MOBIC Take 1-2 tablets (7.5-15 mg total) by mouth daily.   montelukast 10 MG tablet Commonly known as: SINGULAIR Take 1 tablet (10 mg total) by mouth at bedtime. What changed:   when to take this  reasons to take this   Olopatadine HCl 0.7 % Soln  Commonly known as: Pazeo Place 1 drop into both eyes daily. What changed:   when to take this  reasons to take this   ondansetron 4 MG tablet Commonly known as: ZOFRAN TAKE 1 TABLET(4 MG) BY MOUTH EVERY 8 HOURS AS NEEDED FOR NAUSEA OR VOMITING What changed: See the new instructions.   Peak Flow Meter Devi Commonly known as: Peak Air Peak Flow Meter Use peak flow as instructed for asthma J45.41   propranolol ER 60 MG 24 hr capsule Commonly known as: INDERAL LA Take 60 mg by mouth daily.   Qvar 40 MCG/ACT inhaler Generic drug: beclomethasone Inhale into the lungs.   topiramate 50 MG tablet Commonly known as: TOPAMAX Take 1 tablet (50 mg total) by mouth daily.   triamcinolone cream 0.1 % Commonly known as: KENALOG APPLY EXTERNALLY TO THE AFFECTED AREA TWICE DAILY       Known medication allergies: Allergies  Allergen Reactions  . Lactose Other (See Comments)    Nausea, vomiting, diarrhea.   . Diamox [Acetazolamide] Other (See Comments)    Tingling in fingers and lips     Physical examination: Blood pressure 132/90, pulse 79, temperature (!) 97.4 F (36.3 C), temperature source Temporal, resp. rate 16, height 5\' 6"  (1.676 m), weight 254 lb 6.4 oz (115.4 kg), SpO2 99 %.  General: Alert, interactive, in no acute distress. HEENT: TMs pearly gray, turbinates markedly edematous without discharge, post-pharynx non erythematous. Neck: Supple without lymphadenopathy. Lungs: Clear to auscultation without wheezing, rhonchi or rales. {no increased work of breathing. CV: Normal S1, S2 without murmurs. Abdomen: Nondistended, nontender. Skin: Warm and dry, without lesions or rashes. Extremities:  No clubbing, cyanosis or edema. Neuro:   Grossly intact.  Diagnositics/Labs:  Spirometry: FEV1: 3.05L 103%, FVC: 3.59L 103%, ratio consistent with nonobstructive pattern  Assessment and plan:   Allergic rhinitis with conjunctivitis  - continue avoidance measures for grasses,  weeds, trees, molds and dust mites.   - for itchy/watery/red eyes use OTC Alaway or Zaditor 1 drop each eye twice a day or Pataday 1 drop each eye daily as needed   - use nasal saline rinse daily at this time  - for nasal congestion/stuffiness recommend use of Nasacort or Flonase 2 sprays each nostril daily.  Use for 1-2 weeks at time before stopping once symptoms improve.  Hold your steroid spray while nose is tender feeling.  If having nosebleeds decrease to 3 days a week use of stop completely.   - for nasal drainage/post-nasal drip recommend nasal anthistamine Astelin 2 sprays each nostril twice a day  - continue Xyzal 5mg  daily   - continue singulair 10mg  daily  - allergen immunotherapy has been discussed including protocol, benefits and risk previously.   If medication management is ineffective then recommend consider course of immunotherapy.  Asthma, moderate persistent with exacerbation - currently triggered  by fumes/particles in air at work - increase Qvar 80mcg redihaler to 2 puff twice a day.  - continue singulair 10mg  daily - provided with prednisone pack to use if symptoms worsen or nasal symptoms above worsen where you have complete obstruction - have access to albuterol inhaler 2 puffs every 4-6 hours as needed for cough/wheeze/shortness of breath/chest tightness.  May use 15-20 minutes prior to activity.   Monitor frequency of use.    Asthma control goals:   Full participation in all desired activities (may need albuterol before activity)  Albuterol use two time or less a week on average (not counting use with activity)  Cough interfering with sleep two time or less a month  Oral steroids no more than once a year  No hospitalizations  Atopic dermatitis  - continue as needed use of triamcinolone with flares  - continue Eucrisa samples that you can use alone or layered triamcinolone  - daily moisturization with emollients like Vaseline, Aquafor, Eucerin, CeraVe  Nickel  allergy   - avoid contact exposure to nickel based products  Follow-up 6 months or sooner if needed I appreciate the opportunity to take part in Elnoria's care. Please do not hesitate to contact me with questions.  Sincerely,   Margo AyeShaylar Cyani Kallstrom, MD Allergy/Immunology [facility]

## 2018-11-29 NOTE — Patient Instructions (Addendum)
Allergic rhinitis with conjunctivitis  - continue avoidance measures for grasses, weeds, trees, molds and dust mites.   - for itchy/watery/red eyes use OTC Alaway or Zaditor 1 drop each eye twice a day or Pataday 1 drop each eye daily as needed   - use nasal saline rinse daily at this time  - for nasal congestion/stuffiness recommend use of Nasacort or Flonase 2 sprays each nostril daily.  Use for 1-2 weeks at time before stopping once symptoms improve.  Hold your steroid spray while nose is tender feeling.  If having nosebleeds decrease to 3 days a week use of stop completely.   - for nasal drainage/post-nasal drip recommend nasal anthistamine Astelin 2 sprays each nostril twice a day  - continue Xyzal 5mg  daily   - continue singulair 10mg  daily  - allergen immunotherapy has been discussed including protocol, benefits and risk previously.   If medication management is ineffective then recommend consider course of immunotherapy.  Asthma, moderate persistent - currently triggered by fumes/particles in air at work - increase Qvar 74mcg redihaler to 2 puff twice a day.  - continue singulair 10mg  daily - provided with prednisone pack to use if symptoms worsen or nasal symptoms above worsen where you have complete obstruction - have access to albuterol inhaler 2 puffs every 4-6 hours as needed for cough/wheeze/shortness of breath/chest tightness.  May use 15-20 minutes prior to activity.   Monitor frequency of use.    Asthma control goals:   Full participation in all desired activities (may need albuterol before activity)  Albuterol use two time or less a week on average (not counting use with activity)  Cough interfering with sleep two time or less a month  Oral steroids no more than once a year  No hospitalizations  Atopic dermatitis  - continue as needed use of triamcinolone with flares  - continue Eucrisa samples that you can use alone or layered triamcinolone  - daily moisturization  with emollients like Vaseline, Aquafor, Eucerin, CeraVe  Nickel allergy   - avoid contact exposure to nickel based products  Follow-up 6 months or sooner if needed

## 2018-11-30 ENCOUNTER — Telehealth: Payer: Self-pay

## 2018-11-30 NOTE — Telephone Encounter (Signed)
-----   Message from [place], MD sent at 11/29/2018  5:32 PM EDT ----- Regarding: letter Please provide with a letter via my chart and also place in the mail to patient.  ---------------------  To whom it may concern:     :: Insert patient name:: is a patient of mine at the [facility] [place].  She is being managed for her asthma as well as allergic rhinitis and conjunctivitis.  Asthma exacerbations can be brought on by a variety of different triggers including allergens, illnesses, noxious odors etc.  She is having worsening asthma symptoms with inhalation of different odors and inhalants at her workplace including use of disinfectant sprays as well as essential oils in diffusers.  In order to decrease her symptoms it is my recommendation that she be allowed to work in an environment without the use of fragranced sprays and other products that may dispense odorous particles into the air.  If you have any concerns or questions do not hesitate to contact my office.  Sincerely,     Prudy Feeler, MD [facility]

## 2018-11-30 NOTE — Addendum Note (Signed)
Addended by: Valere Dross on: 11/30/2018 06:16 PM   Modules accepted: Orders

## 2018-11-30 NOTE — Telephone Encounter (Signed)
Per request of Dr. Nelva Bush letter was generated and mailed as well as sent via Mychart to patient.

## 2018-12-20 ENCOUNTER — Telehealth: Payer: Self-pay | Admitting: Neurology

## 2018-12-20 NOTE — Telephone Encounter (Signed)
Phone rep checked office voicemail;pt stated on message that it has been almost a month and she has not heard from anyone.  Pt is asking for a call re: an update on the referral.  this voicemail was left @ 3:13p.m.

## 2018-12-21 NOTE — Telephone Encounter (Signed)
I called and spoke to and relayed to her that I had called and left a message for Michele Douglas at  [phone] at [facility] . Relayed to patient that her referral sent a month ago. Patient understood details . Patient will call me back if she needs to.

## 2018-12-21 NOTE — Telephone Encounter (Signed)
Pt called again wanting to know what is going on with her Referral for Weight Management. Please advise.

## 2019-01-18 IMAGING — DX DG FINGER LITTLE 2+V*R*
3 series · 3 of 3 positions shown · non-contrast
Comparison: None.

CLINICAL DATA: Finger injury bent backwards

EXAM:
RIGHT LITTLE FINGER 2+V

[finger ap]
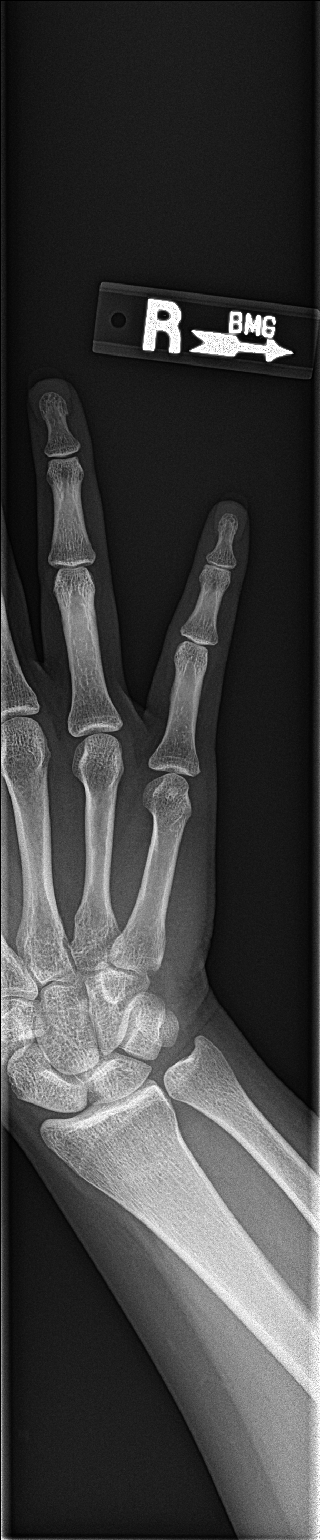

[finger obl]
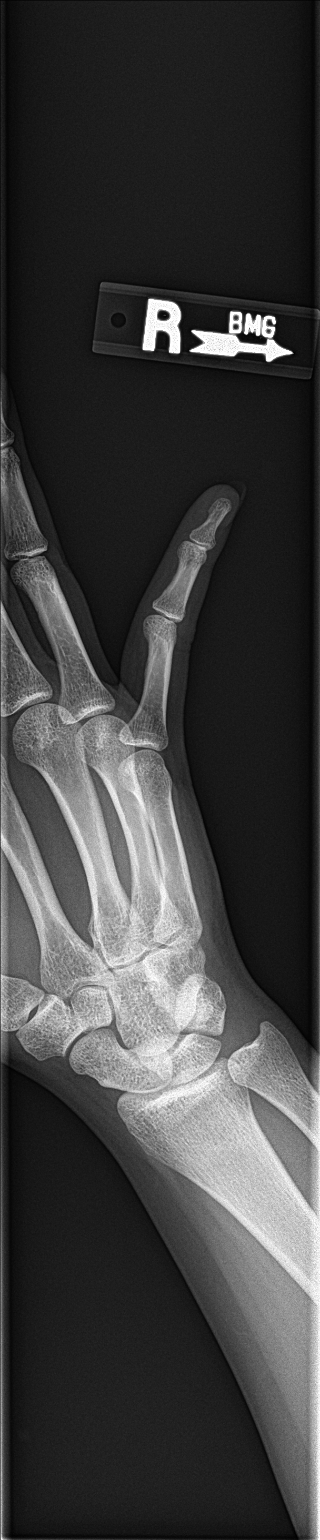

[finger lat]
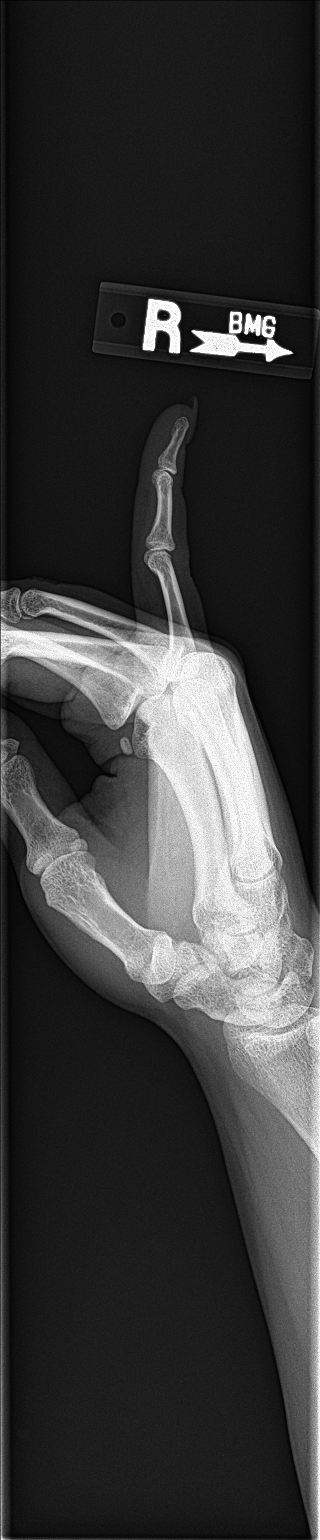

[3 of 3 positions shown; findings below may reference images not displayed]

FINDINGS: Negative for fracture. Mild hyperextension of the fifth PIP joint.
No arthropathy.
IMPRESSION: Hyperextension of the fifth PIP joint.  No fracture.

## 2019-03-01 ENCOUNTER — Other Ambulatory Visit: Payer: Self-pay

## 2019-03-01 MED ORDER — IBUPROFEN 800 MG PO TABS: 800.0000 mg | ORAL_TABLET | Freq: Three times a day (TID) | ORAL | 1 refills | Status: DC | PRN

## 2019-03-07 ENCOUNTER — Other Ambulatory Visit: Payer: Self-pay

## 2019-03-07 ENCOUNTER — Ambulatory Visit (INDEPENDENT_AMBULATORY_CARE_PROVIDER_SITE_OTHER): Payer: BC Managed Care – PPO | Admitting: Bariatrics

## 2019-03-07 ENCOUNTER — Encounter (INDEPENDENT_AMBULATORY_CARE_PROVIDER_SITE_OTHER): Payer: Self-pay | Admitting: Bariatrics

## 2019-03-07 VITALS — BP 120/80 | HR 70 | Temp 98.7°F | Ht 67.0 in | Wt 255.2 lb

## 2019-03-07 DIAGNOSIS — R5383 Other fatigue: Secondary | ICD-10-CM

## 2019-03-07 DIAGNOSIS — J4540 Moderate persistent asthma, uncomplicated: Secondary | ICD-10-CM

## 2019-03-07 DIAGNOSIS — Z6841 Body Mass Index (BMI) 40.0 and over, adult: Secondary | ICD-10-CM

## 2019-03-07 DIAGNOSIS — R0602 Shortness of breath: Secondary | ICD-10-CM | POA: Diagnosis not present

## 2019-03-07 DIAGNOSIS — Z1331 Encounter for screening for depression: Secondary | ICD-10-CM | POA: Diagnosis not present

## 2019-03-07 DIAGNOSIS — G932 Benign intracranial hypertension: Secondary | ICD-10-CM | POA: Diagnosis not present

## 2019-03-07 DIAGNOSIS — Z0289 Encounter for other administrative examinations: Secondary | ICD-10-CM

## 2019-03-07 DIAGNOSIS — Z9189 Other specified personal risk factors, not elsewhere classified: Secondary | ICD-10-CM | POA: Diagnosis not present

## 2019-03-07 NOTE — Telephone Encounter (Signed)
Please Review

## 2019-03-08 ENCOUNTER — Encounter (INDEPENDENT_AMBULATORY_CARE_PROVIDER_SITE_OTHER): Payer: Self-pay | Admitting: Bariatrics

## 2019-03-08 DIAGNOSIS — E786 Lipoprotein deficiency: Secondary | ICD-10-CM | POA: Insufficient documentation

## 2019-03-08 LAB — LIPID PANEL WITH LDL/HDL RATIO
Cholesterol, Total: 140 mg/dL (ref 100–199)
HDL: 37 mg/dL — ABNORMAL LOW (ref >39)
LDL Chol Calc (NIH): 87 mg/dL (ref 0–99)
LDL/HDL Ratio: 2.4 ratio (ref 0.0–3.2)
Triglycerides: 80 mg/dL (ref 0–149)
VLDL Cholesterol Cal: 16 mg/dL (ref 5–40)

## 2019-03-08 LAB — COMPREHENSIVE METABOLIC PANEL
ALT: 7 IU/L (ref 0–32)
AST: 13 IU/L (ref 0–40)
Albumin/Globulin Ratio: 1.3 (ref 1.2–2.2)
Albumin: 4 g/dL (ref 3.9–5.0)
Alkaline Phosphatase: 88 IU/L (ref 39–117)
BUN/Creatinine Ratio: 8 — ABNORMAL LOW (ref 9–23)
BUN: 8 mg/dL (ref 6–20)
Bilirubin Total: 0.7 mg/dL (ref 0.0–1.2)
CO2: 21 mmol/L (ref 20–29)
Calcium: 9 mg/dL (ref 8.7–10.2)
Chloride: 105 mmol/L (ref 96–106)
Creatinine, Ser: 0.95 mg/dL (ref 0.57–1.00)
GFR calc Af Amer: 94 mL/min/{1.73_m2} (ref >59)
GFR calc non Af Amer: 81 mL/min/{1.73_m2} (ref >59)
Globulin, Total: 3.2 g/dL (ref 1.5–4.5)
Glucose: 81 mg/dL (ref 65–99)
Potassium: 4.2 mmol/L (ref 3.5–5.2)
Sodium: 138 mmol/L (ref 134–144)
Total Protein: 7.2 g/dL (ref 6.0–8.5)

## 2019-03-08 LAB — VITAMIN D 25 HYDROXY (VIT D DEFICIENCY, FRACTURES): Vit D, 25-Hydroxy: 37.6 ng/mL (ref 30.0–100.0)

## 2019-03-08 LAB — T4, FREE: Free T4: 1.14 ng/dL (ref 0.82–1.77)

## 2019-03-08 LAB — T3: T3, Total: 117 ng/dL (ref 71–180)

## 2019-03-08 LAB — TSH: TSH: 1.5 u[IU]/mL (ref 0.450–4.500)

## 2019-03-08 LAB — HEMOGLOBIN A1C
Est. average glucose Bld gHb Est-mCnc: 103 mg/dL
Hgb A1c MFr Bld: 5.2 % (ref 4.8–5.6)

## 2019-03-08 LAB — INSULIN, RANDOM: INSULIN: 6.6 u[IU]/mL (ref 2.6–24.9)

## 2019-03-08 NOTE — Progress Notes (Signed)
Dear Michele Denmark, NP,   Thank you for referring Michele Douglas to our clinic. The following note includes my evaluation and treatment recommendations.  Subjective:   Chief Complaint: OBESITY Michele Douglas (MR# [id number]) is a 29 y.o. female who presents for evaluation and treatment of obesity and related comorbidities. Current BMI is Body mass index is 39.97 kg/m.Marland Kitchen Michele Douglas has been struggling with her weight for many years and has been unsuccessful in either losing weight, maintaining weight loss, or reaching her healthy weight goal.  Michele Douglas states she is currently in the action stage of change and ready to dedicate time achieving and maintaining a healthier weight. Michele Douglas is interested in becoming our patient and working on intensive lifestyle modifications including (but not limited to) diet and exercise for weight loss.  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  Other fatigue and SOB (shortness of breath) on exertion. Michele Douglas gets out of breath with exercise.   Idiopathic intracranial hypertension. Michele Douglas is on Topamax, but not diamox or other medications. She is seeing a neurologist.   Moderate persistent asthma, unspecified whether complicated. Michele Douglas is taking QVAR/Albuterol.  At risk for deficient intake of food. This is due to meal skipping and associated with possible obesity malnutrition due to too many unhealthy foods.   Depression Screen Michele Douglas's Food and Mood (modified PHQ-9) score was 3. Depression screen  PHQ 2/9 03/07/2019  Decreased Interest 1  Down, Depressed, Hopeless 1  PHQ - 2 Score 2  Altered sleeping 0  Tired, decreased energy 0  Change in appetite 0  Feeling bad or failure about yourself  1  Trouble concentrating 0  Moving slowly or fidgety/restless 0  Suicidal thoughts 0  PHQ-9 Score 3  Difficult doing work/chores Not difficult at all    Assessment/Plan:   Other fatigue and SOB (shortness of breath) on exertion. IC was done and discussed. We will base her plan on IC and bioimpedance findings.  EKG 12-Lead, HgB A1c, Insulin, random, Lipid Panel With LDL/HDL Ratio, Vitamin D (25 hydroxy), T3, T4, free, TSH were ordered.  Idiopathic intracranial hypertension.  Michele Douglas will follow-up with Neurology ([facility]). She was advised weight loss is important for IIH. Comprehensive Metabolic Panel (CMET), Lipid Panel with LDL/HDL Ratio were ordered.  Moderate persistent asthma, unspecified whether complicated. Michele Douglas will use Albuterol with exercise.  At risk for deficient intake of food. Michele Douglas was given approximately 15 minutes of counseling today regarding prevention of malnutrition. Michele Douglas was advised that having bariatric surgery increases her risk for anemia, malnutrition, and vitamin deficiencies.   Class 3 severe obesity with serious comorbidity and body mass index (BMI) of 40.0 to 44.9 in adult, unspecified obesity type (Michele Douglas).  Obesity Michele Douglas is currently in the action stage of change and her goal is to continue with weight loss efforts. I recommend [name] begin the structured treatment plan as follows:  She has agreed to Category 2 Plan. She will stop sugary drinks, work on meal planning, and will not skip meals.  We discussed the following exercise goals today: [name] will  continue her current exercise regimen (HIIT and strength/walking).   We discussed the following behavioral modification strategies today: increasing lean protein intake, decreasing simple carbohydrates,  increasing vegetables, increasing water intake, decreasing eating out, no skipping meals, meal planning and cooking strategies, keeping healthy foods in the home and planning for success.  She was informed of the importance of frequent follow-up visits to maximize her success with intensive lifestyle modifications for her multiple health conditions. She was informed we would discuss her lab results at her next visit unless there is a critical issue that needs to be addressed sooner. [name] agreed to keep her next visit at the agreed upon time to discuss these results.  Objective:   Blood pressure 120/80, pulse 70, temperature 98.7 F (37.1 C), height 5\' 7"  (1.702 m), weight 255 lb 3.2 oz (115.8 kg), last menstrual period 03/05/2019, SpO2 99 %. Body mass index is 39.97 kg/m.  EKG: Sinus  Rhythm with a rate of 71 BPM. Low voltage in precordial leads most likely related to body habitus. Abnormal precordial QRS contours and T-abnormality, no acute process. Otherwise normal.  Indirect Calorimeter completed today shows a VO2 of 249 and a REE of 1733.  Her calculated basal metabolic rate is 05/03/2019 thus her basal metabolic rate is worse than expected.  General: Cooperative, alert, well developed, in no acute distress. HEENT: Conjunctivae and lids unremarkable. Neck: No thyromegaly.  Cardiovascular: Regular rhythm.  Lungs: Normal work of breathing. Extremities: No edema.  Neurologic: No focal deficits.   Lab Results  Component Value Date   CREATININE 0.95 03/07/2019   BUN 8 03/07/2019   NA 138 03/07/2019   K 4.2 03/07/2019   CL 105 03/07/2019   CO2 21 03/07/2019   Lab Results  Component Value Date   ALT 7 03/07/2019   AST 13 03/07/2019   ALKPHOS 88 03/07/2019   BILITOT 0.7 03/07/2019   Lab Results  Component Value Date   HGBA1C 5.2 03/07/2019   HGBA1C 5.3 12/09/2017   Lab Results  Component Value Date   INSULIN WILL FOLLOW 03/07/2019   Lab Results  Component Value Date   TSH  1.500 03/07/2019   Lab Results  Component Value Date   CHOL 140 03/07/2019   HDL 37 (L) 03/07/2019   LDLCALC 87 03/07/2019   TRIG 80 03/07/2019   CHOLHDL 2.9 09/06/2018   Lab Results  Component Value Date   WBC 8.0 09/06/2018   HGB 12.1 09/06/2018   HCT 35.9 09/06/2018   MCV 86.9 09/06/2018   PLT 392 09/06/2018   No results found for: IRON, TIBC, FERRITIN   Attestation Statements:   Reviewed by clinician on day of visit: allergies, medications, problem list, medical history, surgical history, family history, social history and previous encounter notes.  This visit occurred during the SARS-CoV-2 public health emergency.  Safety protocols were in place, including screening questions prior to the visit, additional usage of staff PPE, and extensive cleaning of exam room while observing appropriate contact time as indicated for disinfecting solutions. (CPT 11/07/2018)  I, [id number], am acting as transcriptionist for Marianna Payment, DO  I have reviewed the above documentation for accuracy and completeness, and I agree with the above. [employer], DO

## 2019-03-10 ENCOUNTER — Encounter (INDEPENDENT_AMBULATORY_CARE_PROVIDER_SITE_OTHER): Payer: Self-pay | Admitting: Bariatrics

## 2019-03-20 ENCOUNTER — Ambulatory Visit: Payer: BC Managed Care – PPO | Attending: Internal Medicine

## 2019-03-20 DIAGNOSIS — Z23 Encounter for immunization: Secondary | ICD-10-CM | POA: Insufficient documentation

## 2019-03-20 NOTE — Progress Notes (Signed)
   Covid-19 Vaccination Clinic  Name:  Michele Douglas    MRN: [id number] DOB: 1990-09-26  03/20/2019  [name] was observed post Covid-19 immunization for 15 minutes without incidence. She was provided with Vaccine Information Sheet and instruction to access the V-Safe system.   [name] was instructed to call 911 with any severe reactions post vaccine: Marland Kitchen Difficulty breathing  . Swelling of your face and throat  . A fast heartbeat  . A bad rash all over your body  . Dizziness and weakness    Immunizations Administered    Name Date Dose VIS Date Route   Pfizer COVID-19 Vaccine 03/20/2019  2:13 PM 0.3 mL 02/09/2019 Intramuscular   Manufacturer: [employer], [employer]   Lot: [id number]   NDC: [address]-8

## 2019-03-21 ENCOUNTER — Other Ambulatory Visit: Payer: Self-pay

## 2019-03-21 ENCOUNTER — Ambulatory Visit (INDEPENDENT_AMBULATORY_CARE_PROVIDER_SITE_OTHER): Payer: BC Managed Care – PPO | Admitting: Bariatrics

## 2019-03-21 VITALS — BP 105/69 | HR 76 | Temp 98.5°F | Ht 67.0 in | Wt 248.0 lb

## 2019-03-21 DIAGNOSIS — Z9189 Other specified personal risk factors, not elsewhere classified: Secondary | ICD-10-CM | POA: Diagnosis not present

## 2019-03-21 DIAGNOSIS — E786 Lipoprotein deficiency: Secondary | ICD-10-CM | POA: Diagnosis not present

## 2019-03-21 DIAGNOSIS — E559 Vitamin D deficiency, unspecified: Secondary | ICD-10-CM | POA: Diagnosis not present

## 2019-03-21 DIAGNOSIS — G932 Benign intracranial hypertension: Secondary | ICD-10-CM | POA: Diagnosis not present

## 2019-03-21 DIAGNOSIS — Z6838 Body mass index (BMI) 38.0-38.9, adult: Secondary | ICD-10-CM

## 2019-03-21 MED ORDER — VITAMIN D (ERGOCALCIFEROL) 1.25 MG (50000 UNIT) PO CAPS: 50000.0000 [IU] | ORAL_CAPSULE | ORAL | 0 refills | Status: DC

## 2019-03-22 ENCOUNTER — Encounter (INDEPENDENT_AMBULATORY_CARE_PROVIDER_SITE_OTHER): Payer: Self-pay | Admitting: Bariatrics

## 2019-03-22 NOTE — Progress Notes (Signed)
Chief Complaint:   OBESITY Michele Douglas is here to discuss her progress with her obesity treatment plan along with follow-up of her obesity related diagnoses. [name] is on the Category 2 Plan and states she is following her eating plan approximately 0% of the time. [name] states she is exercising 0 minutes 0 times per week.  Today's visit was #: 2 Starting weight: 255 lbs Starting date: 03/07/2019 Today's weight: 248 lbs Today's date: 03/21/2019 Total lbs lost to date: 7 Total lbs lost since last in-office visit: 7  Interim History: [name] is down 7 lbs. She has not been following our plan, but is on the "[employer] [employer]" and will be done on Sunday.  Subjective:   Vitamin D deficiency. [name] has been on high dose Vitamin D and now is on OTC Vitamin D. Last Vitamin D 37.6 on 01/06/201.  Low HDL (under 40). HDL cholesterol 43 on 09/06/2018; 37 on 03/07/2019.  Pseudotumor cerebri. [name] is taking Topamax. No headache.  At risk for osteoporosis. [name] is at higher risk of osteopenia and osteoporosis due to Vitamin D deficiency.   Assessment/Plan:   Vitamin D deficiency.  Low Vitamin D level contributes to fatigue and are associated with obesity, breast, and colon cancer. She agrees to start prescription Vitamin D, Ergocalciferol, (DRISDOL) 1.25 MG (50000 UNIT) CAPS capsule every week #4 with 0 refillsand will follow-up for routine testing of Vitamin D, at least 2-3 times per year to avoid over-replacement.   Low HDL (under 40). [name] will decrease saturated fats, add mono and polysaturated fats, and increase exercise and activity overall.  Pseudotumor cerebri. Darilyn will follow-up with her neurologist/PCP as needed.  At risk for osteoporosis. [name] was given approximately 15 minutes of osteoporosis prevention counseling today. [name] is at risk for osteopenia and osteoporosis due to her Vitamin D deficiency. She was encouraged to take her Vitamin D and follow her  higher calcium diet and increase strengthening exercise to help strengthen her bones and decrease her risk of osteopenia and osteoporosis.  Repetitive spaced learning was employed today to elicit superior memory formation and behavioral change.  Class 2 severe obesity with serious comorbidity and body mass index (BMI) of 38.0 to 38.9 in adult, unspecified obesity type (HCC).  [name] is currently in the action stage of change. As such, her goal is to continue with weight loss efforts. She has agreed to the Category 2 Plan.   She will work on meal planning and intentional eating.  She will use "My Fitness Pal".  Handout given on Protein Content of Foods.  Exercise goals: For substantial health benefits, adults should do at least 150 minutes (2 hours and 30 minutes) a week of moderate-intensity, or 75 minutes (1 hour and 15 minutes) a week of vigorous-intensity aerobic physical activity, or an equivalent combination of moderate- and vigorous-intensity aerobic activity. Aerobic activity should be performed in episodes of at least 10 minutes, and preferably, it should be spread throughout the week. Adults should also include muscle-strengthening activities that involve all major muscle groups on 2 or more days a week.  Behavioral modification strategies: increasing lean protein intake, decreasing simple carbohydrates, increasing vegetables, increasing water intake, decreasing eating out, no skipping meals, meal planning and cooking strategies, keeping healthy foods in the home and planning for success.  [name] has agreed to follow-up with our clinic in 2 weeks. She was informed of the importance of frequent follow-up visits to maximize her success with intensive lifestyle modifications for her multiple health  conditions.   Objective:   Blood pressure 105/69, pulse 76, temperature 98.5 F (36.9 C), height 5\' 7"  (1.702 m), weight 248 lb (112.5 kg), last menstrual period 03/05/2019, SpO2 97 %. Body  mass index is 38.84 kg/m.  General: Cooperative, alert, well developed, in no acute distress. HEENT: Conjunctivae and lids unremarkable. Cardiovascular: Regular rhythm.  Lungs: Normal work of breathing. Neurologic: No focal deficits.   Lab Results  Component Value Date   CREATININE 0.95 03/07/2019   BUN 8 03/07/2019   NA 138 03/07/2019   K 4.2 03/07/2019   CL 105 03/07/2019   CO2 21 03/07/2019   Lab Results  Component Value Date   ALT 7 03/07/2019   AST 13 03/07/2019   ALKPHOS 88 03/07/2019   BILITOT 0.7 03/07/2019   Lab Results  Component Value Date   HGBA1C 5.2 03/07/2019   HGBA1C 5.3 12/09/2017   Lab Results  Component Value Date   INSULIN 6.6 03/07/2019   Lab Results  Component Value Date   TSH 1.500 03/07/2019   Lab Results  Component Value Date   CHOL 140 03/07/2019   HDL 37 (L) 03/07/2019   LDLCALC 87 03/07/2019   TRIG 80 03/07/2019   CHOLHDL 2.9 09/06/2018   Lab Results  Component Value Date   WBC 8.0 09/06/2018   HGB 12.1 09/06/2018   HCT 35.9 09/06/2018   MCV 86.9 09/06/2018   PLT 392 09/06/2018   No results found for: IRON, TIBC, FERRITIN  Attestation Statements:   Reviewed by clinician on day of visit: allergies, medications, problem list, medical history, surgical history, family history, social history, and previous encounter notes.  Migdalia Dk, am acting as Location manager for [employer], DO   I have reviewed the above documentation for accuracy and completeness, and I agree with the above. Jearld Lesch, DO

## 2019-04-05 ENCOUNTER — Other Ambulatory Visit: Payer: Self-pay

## 2019-04-05 ENCOUNTER — Ambulatory Visit (INDEPENDENT_AMBULATORY_CARE_PROVIDER_SITE_OTHER): Payer: BC Managed Care – PPO | Admitting: Family Medicine

## 2019-04-05 ENCOUNTER — Encounter (INDEPENDENT_AMBULATORY_CARE_PROVIDER_SITE_OTHER): Payer: Self-pay | Admitting: Family Medicine

## 2019-04-05 VITALS — BP 120/81 | HR 70 | Temp 98.5°F | Ht 67.0 in | Wt 244.0 lb

## 2019-04-05 DIAGNOSIS — G932 Benign intracranial hypertension: Secondary | ICD-10-CM

## 2019-04-05 DIAGNOSIS — Z6838 Body mass index (BMI) 38.0-38.9, adult: Secondary | ICD-10-CM | POA: Diagnosis not present

## 2019-04-05 NOTE — Progress Notes (Signed)
Chief Complaint:   OBESITY Michele Douglas is here to discuss her progress with her obesity treatment plan along with follow-up of her obesity related diagnoses. Michele Douglas is on the Category 2 Plan and states she is following her eating plan approximately 0% of the time. Michele Douglas states she is exercising 0 minutes 0 times per week.  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 . She historically skips meals and she does not eat much. She journals daily and she eats less than 1,000 calories a day.   Subjective:   Pseudotumor cerebri Michele Douglas reports headaches are well controlled. She is on Topamax for headaches. She sees neurology regularly.  Assessment/Plan:   Pseudotumor cerebri Michele Douglas will continue to follow up with neurology, [facility] at [facility].  Class 2 severe obesity with serious comorbidity and body mass index (BMI) of 38.0 to 38.9 in adult, unspecified obesity type Michele Douglas) Michele Douglas is currently in the action stage of change. As such, her goal is to continue with weight loss efforts. She has agreed to the Category 1 Plan.   Exercise goals: No exercise has been prescribed at this time.  Behavioral modification strategies: increasing lean protein intake, increase calories,  no skipping meals and planning for success.  Michele Douglas has agreed to follow-up with our clinic in 2 weeks. She was informed of the importance of frequent follow-up visits to maximize her success with intensive lifestyle modifications for her multiple health conditions.   Objective:   Blood pressure 120/81, pulse 70, temperature 98.5 F (36.9 C), temperature source Oral, height 5\' 7"  (1.702 m), weight 244 lb (110.7 kg), last menstrual period 04/02/2019, SpO2 99 %. Body mass index is  38.22 kg/m.  General: Cooperative, alert, well developed, in no acute distress. HEENT: Conjunctivae and lids unremarkable. Cardiovascular: Regular rhythm.  Lungs: Normal work of breathing. Neurologic: No focal deficits.   Lab Results  Component Value Date   CREATININE 0.95 03/07/2019   BUN 8 03/07/2019   NA 138 03/07/2019   K 4.2 03/07/2019   CL 105 03/07/2019   CO2 21 03/07/2019   Lab Results  Component Value Date   ALT 7 03/07/2019   AST 13 03/07/2019   ALKPHOS 88 03/07/2019   BILITOT 0.7 03/07/2019   Lab Results  Component Value Date   HGBA1C 5.2 03/07/2019   HGBA1C 5.3 12/09/2017   Lab Results  Component Value Date   INSULIN 6.6 03/07/2019   Lab Results  Component Value Date   TSH 1.500 03/07/2019   Lab Results  Component Value Date   CHOL 140 03/07/2019   HDL 37 (L) 03/07/2019   LDLCALC 87 03/07/2019   TRIG 80 03/07/2019   CHOLHDL 2.9 09/06/2018   Lab Results  Component Value Date   WBC 8.0 09/06/2018   HGB 12.1 09/06/2018   HCT 35.9 09/06/2018   MCV 86.9 09/06/2018   PLT 392 09/06/2018   No results found for: IRON, TIBC, FERRITIN   Ref. Range 03/07/2019 10:12  Vitamin D, 25-Hydroxy Latest Ref Range: 30.0 - 100.0 ng/mL 37.6   Attestation Statements:   Reviewed by clinician on day of visit: allergies, medications, problem list, medical history, surgical history, family history, social history, and previous encounter notes.  Corey Skains, am acting as transcriptionist  for [place], FNP-C.  I have reviewed the above documentation for accuracy and completeness, and I agree with the above. -  Gerlene Glassburn [employer], FNP-C

## 2019-04-09 ENCOUNTER — Encounter (INDEPENDENT_AMBULATORY_CARE_PROVIDER_SITE_OTHER): Payer: Self-pay | Admitting: Family Medicine

## 2019-04-09 ENCOUNTER — Ambulatory Visit: Payer: BC Managed Care – PPO | Attending: Internal Medicine

## 2019-04-09 DIAGNOSIS — Z23 Encounter for immunization: Secondary | ICD-10-CM | POA: Insufficient documentation

## 2019-04-09 DIAGNOSIS — Z6838 Body mass index (BMI) 38.0-38.9, adult: Secondary | ICD-10-CM | POA: Insufficient documentation

## 2019-04-09 NOTE — Progress Notes (Signed)
   Covid-19 Vaccination Clinic  Name:  Michele Douglas    MRN: [id number] DOB: [birth year]  04/09/2019  Michele Douglas was observed post Covid-19 immunization for 15 minutes without incidence. She was provided with Vaccine Information Sheet and instruction to access the V-Safe system.   Michele Douglas was instructed to call 911 with any severe reactions post vaccine: Marland Kitchen Difficulty breathing  . Swelling of your face and throat  . A fast heartbeat  . A bad rash all over your body  . Dizziness and weakness    Immunizations Administered    Name Date Dose VIS Date Route   Pfizer COVID-19 Vaccine 04/09/2019  2:05 PM 0.3 mL 02/09/2019 Intramuscular   Manufacturer: [employer], [employer]   Lot: [id number]   NDC: [address]-4

## 2019-04-11 ENCOUNTER — Other Ambulatory Visit: Payer: Self-pay | Admitting: *Deleted

## 2019-04-11 MED ORDER — IBUPROFEN 800 MG PO TABS: 800.0000 mg | ORAL_TABLET | Freq: Three times a day (TID) | ORAL | 1 refills | Status: AC | PRN

## 2019-04-11 NOTE — Telephone Encounter (Signed)
Patient takes for cycles.

## 2019-04-19 ENCOUNTER — Other Ambulatory Visit (INDEPENDENT_AMBULATORY_CARE_PROVIDER_SITE_OTHER): Payer: Self-pay | Admitting: Bariatrics

## 2019-04-19 ENCOUNTER — Ambulatory Visit (INDEPENDENT_AMBULATORY_CARE_PROVIDER_SITE_OTHER): Payer: BC Managed Care – PPO | Admitting: Family Medicine

## 2019-04-19 DIAGNOSIS — E559 Vitamin D deficiency, unspecified: Secondary | ICD-10-CM

## 2019-04-26 ENCOUNTER — Encounter (INDEPENDENT_AMBULATORY_CARE_PROVIDER_SITE_OTHER): Payer: Self-pay | Admitting: Family Medicine

## 2019-04-26 ENCOUNTER — Ambulatory Visit (INDEPENDENT_AMBULATORY_CARE_PROVIDER_SITE_OTHER): Payer: BC Managed Care – PPO | Admitting: Family Medicine

## 2019-04-26 ENCOUNTER — Other Ambulatory Visit: Payer: Self-pay

## 2019-04-26 VITALS — BP 95/60 | HR 80 | Temp 98.6°F | Ht 67.0 in | Wt 245.0 lb

## 2019-04-26 DIAGNOSIS — Z9189 Other specified personal risk factors, not elsewhere classified: Secondary | ICD-10-CM | POA: Diagnosis not present

## 2019-04-26 DIAGNOSIS — G932 Benign intracranial hypertension: Secondary | ICD-10-CM

## 2019-04-26 DIAGNOSIS — E559 Vitamin D deficiency, unspecified: Secondary | ICD-10-CM | POA: Diagnosis not present

## 2019-04-26 DIAGNOSIS — Z6838 Body mass index (BMI) 38.0-38.9, adult: Secondary | ICD-10-CM

## 2019-04-26 MED ORDER — VITAMIN D (ERGOCALCIFEROL) 1.25 MG (50000 UNIT) PO CAPS: 50000.0000 [IU] | ORAL_CAPSULE | ORAL | 0 refills | Status: DC

## 2019-04-26 NOTE — Progress Notes (Signed)
Chief Complaint:   OBESITY Michele Douglas is here to discuss her progress with her obesity treatment plan along with follow-up of her obesity related diagnoses. Michele Douglas is on the Category 1 Plan and Category 2 and states she is following her eating plan approximately 85% of the time. Michele Douglas states she is exercising 0 minutes 0 times per week.  Today's visit was #: 4 Starting weight: 255 lbs Starting date: 03/07/2019 Today's weight: 245 lbs Today's date: 04/26/2019 Total lbs lost to date: 10 Total lbs lost since last in-office visit: 0  Interim History: Michele Douglas recently had a birthday and did some celebration eating. She tends to skip meals and struggles to eat enough protein. She does not like the breakfast choices and eats no dairy. She tends to deviate from the plan due to lack of variety of food.  Subjective:   Vitamin D deficiency. Last Vitamin D level not at goal at 37.6 on 03/07/2019. Michele Douglas is on prescription Vitamin D.  Idiopathic intracranial hypertension. Michele Douglas reports headaches are well controlled with Topamax. She saw Dr. Thomasena Edis (neuro) at [facility] 2 days ago. His office note reflects her condition is stable.  At risk for deficient intake of food. The patient is at a higher than average risk of deficient intake of food due to frequently skipping meals and struggling to get in enough protein. Michele Douglas does not drink milk or eat dairy products which further hinders her ability to eat enough protein..  Assessment/Plan:   Vitamin D deficiency. Low Vitamin D level contributes to fatigue and are associated with obesity, breast, and colon cancer. She was given a refill on her Vitamin D, Ergocalciferol, (DRISDOL) 1.25 MG (50000 UNIT) CAPS capsule every week #4 with 0 refills and will follow-up for routine testing of Vitamin D, at least 2-3 times per year to avoid over-replacement.      Idiopathic intracranial hypertension. Michele Douglas will continue to follow-up with Neurology and  will continue weight loss.  At risk for deficient intake of food. Michele Douglas was given approximately 15 minutes of deficit intake of food prevention counseling today. Michele Douglas is at risk for eating too few calories based on current food recall. She was encouraged to focus on meeting caloric and protein goals according to her recommended meal plan. We discussed the importance of eating regularly and getting sufficient protein.  Class 2 severe obesity with serious comorbidity and body mass index (BMI) of 38.0 to 38.9 in adult, unspecified obesity type (HCC).  Michele Douglas is currently in the action stage of change. As such, her goal is to continue with weight loss efforts. She has agreed to keeping a food journal and adhering to recommended goals of 1100-1200 calories and 80 grams of protein daily.   Handouts were given on Protein Content and Journaling.  Exercise goals: has not been prescribed exercise at this time.  Behavioral modification strategies: increasing lean protein intake and keeping a strict food journal.  Michele Douglas has agreed to follow-up with our clinic in 2 weeks. She was informed of the importance of frequent follow-up visits to maximize her success with intensive lifestyle modifications for her multiple health conditions.   Objective:   Blood pressure 95/60, pulse 80, temperature 98.6 F (37 C), temperature source Oral, height 5\' 7"  (1.702 m), weight 245 lb (111.1 kg), last menstrual period 04/02/2019, SpO2 99 %. Body mass index is 38.37 kg/m.  General: Cooperative, alert, well developed, in no acute distress. HEENT: Conjunctivae and lids unremarkable. Cardiovascular: Regular rhythm.  Lungs: Normal work  of breathing. Neurologic: No focal deficits.   Lab Results  Component Value Date   CREATININE 0.95 03/07/2019   BUN 8 03/07/2019   NA 138 03/07/2019   K 4.2 03/07/2019   CL 105 03/07/2019   CO2 21 03/07/2019   Lab Results  Component Value Date   ALT 7 03/07/2019   AST 13  03/07/2019   ALKPHOS 88 03/07/2019   BILITOT 0.7 03/07/2019   Lab Results  Component Value Date   HGBA1C 5.2 03/07/2019   HGBA1C 5.3 12/09/2017   Lab Results  Component Value Date   INSULIN 6.6 03/07/2019   Lab Results  Component Value Date   TSH 1.500 03/07/2019   Lab Results  Component Value Date   CHOL 140 03/07/2019   HDL 37 (L) 03/07/2019   LDLCALC 87 03/07/2019   TRIG 80 03/07/2019   CHOLHDL 2.9 09/06/2018   Lab Results  Component Value Date   WBC 8.0 09/06/2018   HGB 12.1 09/06/2018   HCT 35.9 09/06/2018   MCV 86.9 09/06/2018   PLT 392 09/06/2018   No results found for: IRON, TIBC, FERRITIN  Attestation Statements:   Reviewed by clinician on day of visit: allergies, medications, problem list, medical history, surgical history, family history, social history, and previous encounter notes.  IMichaelene Song, am acting as Location manager for [employer], FNP   I have reviewed the above documentation for accuracy and completeness, and I agree with the above. -  Georgianne Fick, FNP

## 2019-05-10 ENCOUNTER — Ambulatory Visit (INDEPENDENT_AMBULATORY_CARE_PROVIDER_SITE_OTHER): Payer: BC Managed Care – PPO | Admitting: Physician Assistant

## 2019-05-15 LAB — HM DIABETES EYE EXAM

## 2019-05-16 ENCOUNTER — Ambulatory Visit: Payer: BC Managed Care – PPO | Admitting: Neurology

## 2019-05-26 ENCOUNTER — Other Ambulatory Visit (INDEPENDENT_AMBULATORY_CARE_PROVIDER_SITE_OTHER): Payer: Self-pay | Admitting: Family Medicine

## 2019-05-26 DIAGNOSIS — E559 Vitamin D deficiency, unspecified: Secondary | ICD-10-CM

## 2019-05-30 ENCOUNTER — Encounter: Payer: Self-pay | Admitting: Allergy

## 2019-05-30 ENCOUNTER — Ambulatory Visit: Payer: BC Managed Care – PPO | Admitting: Allergy

## 2019-05-30 ENCOUNTER — Other Ambulatory Visit: Payer: Self-pay

## 2019-05-30 VITALS — BP 110/80 | HR 74 | Temp 97.8°F | Resp 16 | Ht 67.0 in | Wt 249.0 lb

## 2019-05-30 DIAGNOSIS — L2089 Other atopic dermatitis: Secondary | ICD-10-CM

## 2019-05-30 DIAGNOSIS — J3089 Other allergic rhinitis: Secondary | ICD-10-CM | POA: Diagnosis not present

## 2019-05-30 DIAGNOSIS — H1013 Acute atopic conjunctivitis, bilateral: Secondary | ICD-10-CM | POA: Diagnosis not present

## 2019-05-30 DIAGNOSIS — Z9109 Other allergy status, other than to drugs and biological substances: Secondary | ICD-10-CM

## 2019-05-30 DIAGNOSIS — T781XXD Other adverse food reactions, not elsewhere classified, subsequent encounter: Secondary | ICD-10-CM

## 2019-05-30 DIAGNOSIS — T781XXA Other adverse food reactions, not elsewhere classified, initial encounter: Secondary | ICD-10-CM

## 2019-05-30 DIAGNOSIS — J4541 Moderate persistent asthma with (acute) exacerbation: Secondary | ICD-10-CM

## 2019-05-30 NOTE — Patient Instructions (Addendum)
Allergic rhinitis with conjunctivitis  - continue avoidance measures for grasses, weeds, trees, molds and dust mites.   - for itchy/watery/red eyes use OTC Alaway or Zaditor 1 drop each eye twice a day or Pataday 1 drop each eye daily as needed   - use nasal saline rinse daily at this time  - for nasal congestion/stuffiness recommend use of Nasacort or Flonase 2 sprays each nostril daily.  Use for 1-2 weeks at time before stopping once symptoms improve. If having nosebleeds decrease to 3 days a week use or stop completely.   - for nasal drainage/post-nasal drip recommend nasal anthistamine Astelin 2 sprays each nostril twice a day  - continue Xyzal 5mg  daily   - continue singulair 10mg  daily  - allergen immunotherapy has been previously discussed including protocol, benefits and risk previously.   If medication management is ineffective then recommend consider course of immunotherapy.  Asthma, moderate persistent - continue Qvar redihaler to 2 puff twice a day.  - continue singulair 10mg  daily - have access to albuterol inhaler 2 puffs every 4-6 hours as needed for cough/wheeze/shortness of breath/chest tightness.  May use 15-20 minutes prior to activity.   Monitor frequency of use.    Asthma control goals:   Full participation in all desired activities (may need albuterol before activity)  Albuterol use two time or less a week on average (not counting use with activity)  Cough interfering with sleep two time or less a month  Oral steroids no more than once a year  No hospitalizations  Atopic dermatitis  - continue as needed use of triamcinolone with flares - daily moisturization with emollients like Vaseline, Aquafor, Eucerin, CeraVe  Nickel allergy   - avoid contact exposure to nickel based products  Adverse Food reaction  - testing to hazelnut today is negative.  Will obtain serum IgE level to hazelnut.  If that testing is negative then you most likely have PFAS as below.     - potentially could be pollen food allergy syndrome. The oral allergy syndrome (OAS) or pollen-food allergy syndrome (PFAS) is a relatively common form of food allergy, particularly in adults. It typically occurs in people who have pollen allergies when the immune system "sees" proteins on the food that look like proteins on the pollen. This results in the allergy antibody (IgE) binding to the food instead of the pollen. Patients typically report itching and/or mild swelling of the mouth and throat immediately following ingestion of certain uncooked fruits (including nuts) or raw vegetables. Only a very small number of affected individuals experience systemic allergic reactions, such as anaphylaxis which occurs with true food allergies (see chart below - this is not an all encompassing chart).  Recommendation is to avoid the food to prevent the oral symptoms.    Follow-up 6 months or sooner if needed

## 2019-05-30 NOTE — Progress Notes (Signed)
Follow-up Note  RE: Michele Douglas MRN: [id number] DOB: [birth year] Date of Office Visit: 05/30/2019   History of present illness: Michele Douglas is a 29 y.o. female presenting today for follow-up of allergic rhinitis with conjunctivitis, asthma, atopic dermatitis, contact dermatitis.  She was last seen in the office on 11/29/2018 by myself. Since this visit she has become more concerned about hazelnut.  She states on several different occasions she has had hazelnut flavored coffee or hazelnut creamer and after eating she has noticed throat itching and hoarseness following.  She states she is not entirely sure if it is the hazelnut per se but every time she has had these oral symptoms develop she notes that she has had some form of hazelnut in her coffee.  She is able to eat all of her tree nuts and peanuts without any issue.  We have checked for a milk IgE in the past that was negative.  She does not consume much dairy products at all in the diet except for may be [place] yogurt that she tolerates without issue.  She has held all antihistamines for testing today. She otherwise states she has not had any significant allergy symptoms rather ocular, nasal or generalized.  She states she has been in the house mostly over the past year.  She states this has been the best year in regards to her allergy symptoms.  She has not needed to use her eyedrop or nose spray.  She does have Xyzal and Singulair on a daily basis.  With her asthma she states the increase in Qvar 80 she notes she has not needed to use her albuterol except for her prior to activity or exercise.  She has not required any further systemic steroids.  She has not had any ED or urgent care visits. She continues to have recurrent a rash on her neck that occurs in the same spot every time.  She does use the triamcinolone that seems to work the best.  Pam Drown samples did not seem to do much for her.  She does continue to avoid nickel.  Review of  systems: Review of Systems  Constitutional: Negative.   HENT: Negative.   Eyes: Negative.   Respiratory: Negative.   Cardiovascular: Negative.   Gastrointestinal: Negative.   Musculoskeletal: Negative.   Skin: Positive for itching and rash.  Neurological: Negative.     All other systems negative unless noted above in HPI  Past medical/social/surgical/family history have been reviewed and are unchanged unless specifically indicated below.  No changes  Medication List: Current Outpatient Medications  Medication Sig Dispense Refill  . albuterol (VENTOLIN HFA) 108 (90 Base) MCG/ACT inhaler INHALE 1-2 PUFFS INTO THE LUNGS EVERY 4 HOURS AS NEEDED 18 g 1  . azelastine (ASTELIN) 0.1 % nasal spray Place 2 sprays into both nostrils 2 (two) times daily. (Patient taking differently: Place 2 sprays into both nostrils as needed. ) 30 mL 5  . beclomethasone (QVAR REDIHALER) 80 MCG/ACT inhaler Inhale 2 puffs into the lungs 2 (two) times daily. 10.6 g 5  . ibuprofen (ADVIL) 800 MG tablet Take 1 tablet (800 mg total) by mouth every 8 (eight) hours as needed. 60 tablet 1  . levocetirizine (XYZAL) 5 MG tablet Take 1 tablet (5 mg total) by mouth as needed. 30 tablet 5  . meclizine (ANTIVERT) 25 MG tablet Take 1 tablet (25 mg total) by mouth 3 (three) times daily as needed for dizziness. 30 tablet 1  . Olopatadine HCl (PAZEO) 0.7 %  SOLN Place 1 drop into both eyes daily. (Patient taking differently: Place 1 drop into both eyes as needed. ) 1 Bottle 5  . ondansetron (ZOFRAN) 4 MG tablet TAKE 1 TABLET(4 MG) BY MOUTH EVERY 8 HOURS AS NEEDED FOR NAUSEA OR VOMITING (Patient taking differently: Take 4 mg by mouth as needed. ) 30 tablet 0  . Peak Flow Meter (PEAK AIR PEAK FLOW METER) DEVI Use peak flow as instructed for asthma J45.41 1 each 0  . propranolol ER (INDERAL LA) 60 MG 24 hr capsule Take 60 mg by mouth daily.  2  . topiramate (TOPAMAX) 50 MG tablet Take 1 tablet (50 mg total) by mouth daily. 90 tablet 1   . triamcinolone cream (KENALOG) 0.1 % APPLY EXTERNALLY TO THE AFFECTED AREA TWICE DAILY 30 g 0  . Vitamin D, Ergocalciferol, (DRISDOL) 1.25 MG (50000 UNIT) CAPS capsule Take 1 capsule (50,000 Units total) by mouth every 7 (seven) days. 4 capsule 0  . fluticasone (FLONASE) 50 MCG/ACT nasal spray SHAKE LIQUID AND USE 2 SPRAYS IN EACH NOSTRIL DAILY (Patient not taking: Reported on 05/30/2019) 50 g 1  . montelukast (SINGULAIR) 10 MG tablet Take 1 tablet (10 mg total) by mouth at bedtime. (Patient not taking: Reported on 05/30/2019) 30 tablet 5   No current facility-administered medications for this visit.     Known medication allergies: Allergies  Allergen Reactions  . Lactose Other (See Comments)    Nausea, vomiting, diarrhea.   . Diamox [Acetazolamide] Other (See Comments)    Tingling in fingers and lips     Physical examination: Blood pressure 110/80, pulse 74, temperature 97.8 F (36.6 C), temperature source Temporal, resp. rate 16, height 5\' 7"  (1.702 m), weight 249 lb (112.9 kg).  General: Alert, interactive, in no acute distress. HEENT: PERRLA, TMs pearly gray, turbinates non-edematous without discharge, post-pharynx non erythematous. Neck: Supple without lymphadenopathy. Lungs: Clear to auscultation without wheezing, rhonchi or rales. {no increased work of breathing. CV: Normal S1, S2 without murmurs. Abdomen: Nondistended, nontender. Skin: Warm and dry, without lesions or rashes. Extremities:  No clubbing, cyanosis or edema. Neuro:   Grossly intact.  Diagnositics/Labs:  Allergy testing: Skin prick testing to hazelnut is negative.  Histamine control was positive. Allergy testing results were read and interpreted by provider, documented by clinical staff.   Assessment and plan: Patient Instructions  Allergic rhinitis with conjunctivitis  - continue avoidance measures for grasses, weeds, trees, molds and dust mites.   - for itchy/watery/red eyes use OTC Alaway or Zaditor 1  drop each eye twice a day or Pataday 1 drop each eye daily as needed   - use nasal saline rinse daily at this time  - for nasal congestion/stuffiness recommend use of Nasacort or Flonase 2 sprays each nostril daily.  Use for 1-2 weeks at time before stopping once symptoms improve. If having nosebleeds decrease to 3 days a week use or stop completely.   - for nasal drainage/post-nasal drip recommend nasal anthistamine Astelin 2 sprays each nostril twice a day  - continue Xyzal 5mg  daily   - continue singulair 10mg  daily  - allergen immunotherapy has been previously discussed including protocol, benefits and risk previously.   If medication management is ineffective then recommend consider course of immunotherapy.  Asthma, moderate persistent - continue Qvar redihaler to 2 puff twice a day.  - continue singulair 10mg  daily - have access to albuterol inhaler 2 puffs every 4-6 hours as needed for cough/wheeze/shortness of breath/chest tightness.  May use  15-20 minutes prior to activity.   Monitor frequency of use.    Asthma control goals:   Full participation in all desired activities (may need albuterol before activity)  Albuterol use two time or less a week on average (not counting use with activity)  Cough interfering with sleep two time or less a month  Oral steroids no more than once a year  No hospitalizations  Atopic dermatitis  - continue as needed use of triamcinolone with flares - daily moisturization with emollients like Vaseline, Aquafor, Eucerin, CeraVe  Nickel allergy   - avoid contact exposure to nickel based products  Adverse Food reaction  - testing to hazelnut today is negative.  Will obtain serum IgE level to hazelnut.  If that testing is negative then you most likely have PFAS as below.    - potentially could be pollen food allergy syndrome. The oral allergy syndrome (OAS) or pollen-food allergy syndrome (PFAS) is a relatively common form of food allergy,  particularly in adults. It typically occurs in people who have pollen allergies when the immune system "sees" proteins on the food that look like proteins on the pollen. This results in the allergy antibody (IgE) binding to the food instead of the pollen. Patients typically report itching and/or mild swelling of the mouth and throat immediately following ingestion of certain uncooked fruits (including nuts) or raw vegetables. Only a very small number of affected individuals experience systemic allergic reactions, such as anaphylaxis which occurs with true food allergies.  Recommendation is to avoid the food to prevent the oral symptoms.    Follow-up 6 months or sooner if needed  I appreciate the opportunity to take part in Davida's care. Please do not hesitate to contact me with questions.  Sincerely,   Prudy Feeler, MD Allergy/Immunology [facility]

## 2019-05-31 ENCOUNTER — Other Ambulatory Visit: Payer: Self-pay

## 2019-05-31 ENCOUNTER — Encounter (INDEPENDENT_AMBULATORY_CARE_PROVIDER_SITE_OTHER): Payer: Self-pay | Admitting: Physician Assistant

## 2019-05-31 ENCOUNTER — Ambulatory Visit (INDEPENDENT_AMBULATORY_CARE_PROVIDER_SITE_OTHER): Payer: BC Managed Care – PPO | Admitting: Physician Assistant

## 2019-05-31 VITALS — BP 113/75 | HR 88 | Temp 97.9°F | Ht 67.0 in | Wt 244.0 lb

## 2019-05-31 DIAGNOSIS — Z9189 Other specified personal risk factors, not elsewhere classified: Secondary | ICD-10-CM | POA: Diagnosis not present

## 2019-05-31 DIAGNOSIS — E7849 Other hyperlipidemia: Secondary | ICD-10-CM | POA: Diagnosis not present

## 2019-05-31 DIAGNOSIS — E559 Vitamin D deficiency, unspecified: Secondary | ICD-10-CM | POA: Diagnosis not present

## 2019-05-31 DIAGNOSIS — Z6838 Body mass index (BMI) 38.0-38.9, adult: Secondary | ICD-10-CM

## 2019-05-31 MED ORDER — VITAMIN D (ERGOCALCIFEROL) 1.25 MG (50000 UNIT) PO CAPS: 50000.0000 [IU] | ORAL_CAPSULE | ORAL | 0 refills | Status: DC

## 2019-05-31 NOTE — Progress Notes (Signed)
Chief Complaint:   OBESITY Michele Douglas is here to discuss her progress with her obesity treatment plan along with follow-up of her obesity related diagnoses. Michele Douglas is keeping a food journal and adhering to recommended goals of 1100-1200 calories and 80 grams of protein and states she is following her eating plan approximately 100% of the time. Michele Douglas states she is doing cardio/strengthening 30-60 minutes 5 times per week.  Today's visit was #: 5 Starting weight: 255 lbs Starting date: 03/07/2019 Today's weight: 244 lbs Today's date: 05/31/2019 Total lbs lost to date: 11 Total lbs lost since last in-office visit: 1  Interim History: Michele Douglas states that she is journaling her food regularly. On average she is getting 30-60 grams of protein daily. She does not consume dairy except for yogurt.  Subjective:   Vitamin D deficiency. Last Vitamin D level was not at goal - 37.6 on 03/07/2019. No nausea, vomiting, or muscle weakness. Michele Douglas is on Vitamin D.  Other hyperlipidemia. Michele Douglas has hyperlipidemia and has been trying to improve her cholesterol levels with intensive lifestyle modification including a low saturated fat diet, exercise and weight loss. She denies any chest pain. She is on no medications. Last level was not at goal.  Lab Results  Component Value Date   ALT 7 03/07/2019   AST 13 03/07/2019   ALKPHOS 88 03/07/2019   BILITOT 0.7 03/07/2019   Lab Results  Component Value Date   CHOL 140 03/07/2019   HDL 37 (L) 03/07/2019   LDLCALC 87 03/07/2019   TRIG 80 03/07/2019   CHOLHDL 2.9 09/06/2018   At risk for osteoporosis. Michele Douglas is at higher risk of osteopenia and osteoporosis due to Vitamin D deficiency.   Assessment/Plan:    Vitamin D deficiency. Low Vitamin D level contributes to fatigue and are associated with obesity, breast, and colon cancer. She was given a refill on her Vitamin D, Ergocalciferol, (DRISDOL) 1.25 MG (50000 UNIT) CAPS capsule every week #4  with 0 refills and will follow-up for routine testing of Vitamin D, at least 2-3 times per year to avoid over-replacement.      Other hyperlipidemia. Cardiovascular risk and specific lipid/LDL goals reviewed.  We discussed several lifestyle modifications today and Michele Douglas will continue to work on diet, exercise and weight loss efforts. Orders and follow up as documented in patient record.   Counseling Intensive lifestyle modifications are the first line treatment for this issue. . Dietary changes: Increase soluble fiber. Decrease simple carbohydrates. . Exercise changes: Moderate to vigorous-intensity aerobic activity 150 minutes per week if tolerated. . Lipid-lowering medications: see documented in medical record.  At risk for osteoporosis. Michele Douglas was given approximately 15 minutes of osteoporosis prevention counseling today. Michele Douglas is at risk for osteopenia and osteoporosis due to her Vitamin D deficiency. She was encouraged to take her Vitamin D and follow her higher calcium diet and increase strengthening exercise to help strengthen her bones and decrease her risk of osteopenia and osteoporosis.  Repetitive spaced learning was employed today to elicit superior memory formation and behavioral change.  Class 2 severe obesity with serious comorbidity and body mass index (BMI) of 38.0 to 38.9 in adult, unspecified obesity type (HCC).  Michele Douglas is currently in the action stage of change. As such, her goal is to continue with weight loss efforts. She has agreed to keeping a food journal and adhering to recommended goals of 1200 calories and 80 grams of protein daily.   Exercise goals: For substantial health benefits, adults should  do at least 150 minutes (2 hours and 30 minutes) a week of moderate-intensity, or 75 minutes (1 hour and 15 minutes) a week of vigorous-intensity aerobic physical activity, or an equivalent combination of moderate- and vigorous-intensity aerobic activity. Aerobic activity  should be performed in episodes of at least 10 minutes, and preferably, it should be spread throughout the week.  Behavioral modification strategies: increasing lean protein intake, no skipping meals and meal planning and cooking strategies.  Michele Douglas has agreed to follow-up with our clinic in 2 weeks. She was informed of the importance of frequent follow-up visits to maximize her success with intensive lifestyle modifications for her multiple health conditions.   Objective:   Blood pressure 113/75, pulse 88, temperature 97.9 F (36.6 C), temperature source Oral, height 5\' 7"  (1.702 m), weight 244 lb (110.7 kg), SpO2 98 %. Body mass index is 38.22 kg/m.  General: Cooperative, alert, well developed, in no acute distress. HEENT: Conjunctivae and lids unremarkable. Cardiovascular: Regular rhythm.  Lungs: Normal work of breathing. Neurologic: No focal deficits.   Lab Results  Component Value Date   CREATININE 0.95 03/07/2019   BUN 8 03/07/2019   NA 138 03/07/2019   K 4.2 03/07/2019   CL 105 03/07/2019   CO2 21 03/07/2019   Lab Results  Component Value Date   ALT 7 03/07/2019   AST 13 03/07/2019   ALKPHOS 88 03/07/2019   BILITOT 0.7 03/07/2019   Lab Results  Component Value Date   HGBA1C 5.2 03/07/2019   HGBA1C 5.3 12/09/2017   Lab Results  Component Value Date   INSULIN 6.6 03/07/2019   Lab Results  Component Value Date   TSH 1.500 03/07/2019   Lab Results  Component Value Date   CHOL 140 03/07/2019   HDL 37 (L) 03/07/2019   LDLCALC 87 03/07/2019   TRIG 80 03/07/2019   CHOLHDL 2.9 09/06/2018   Lab Results  Component Value Date   WBC 8.0 09/06/2018   HGB 12.1 09/06/2018   HCT 35.9 09/06/2018   MCV 86.9 09/06/2018   PLT 392 09/06/2018   No results found for: IRON, TIBC, FERRITIN  Attestation Statements:   Reviewed by clinician on day of visit: allergies, medications, problem list, medical history, surgical history, family history, social history, and  previous encounter notes.  IMichaelene Douglas, am acting as transcriptionist for Michele Potash, PA-C   I have reviewed the above documentation for accuracy and completeness, and I agree with the above. Michele Potash, PA-C

## 2019-06-02 LAB — ALLERGEN HAZELNUT F17: Hazelnut (Filbert) IgE: <0.1 kU/L

## 2019-06-14 ENCOUNTER — Other Ambulatory Visit: Payer: Self-pay

## 2019-06-14 ENCOUNTER — Ambulatory Visit (INDEPENDENT_AMBULATORY_CARE_PROVIDER_SITE_OTHER): Payer: BC Managed Care – PPO | Admitting: Physician Assistant

## 2019-06-14 ENCOUNTER — Encounter (INDEPENDENT_AMBULATORY_CARE_PROVIDER_SITE_OTHER): Payer: Self-pay | Admitting: Physician Assistant

## 2019-06-14 VITALS — BP 110/73 | HR 65 | Temp 98.4°F | Ht 67.0 in | Wt 246.0 lb

## 2019-06-14 DIAGNOSIS — E559 Vitamin D deficiency, unspecified: Secondary | ICD-10-CM

## 2019-06-14 DIAGNOSIS — Z6838 Body mass index (BMI) 38.0-38.9, adult: Secondary | ICD-10-CM

## 2019-06-14 NOTE — Progress Notes (Signed)
Chief Complaint:   OBESITY Michele Douglas is here to discuss her progress with her obesity treatment plan along with follow-up of her obesity related diagnoses. Michele Douglas is on the Category 2 Plan and states she is following her eating plan approximately 80-85% of the time. November states she is doing cardio and strength training for 45 minutes 5 times per week.  Today's visit was #: 6 Starting weight: 255 lbs Starting date: 03/07/2019 Today's weight: 246 lbs Today's date: 06/14/2019 Total lbs lost to date: 9 lbs Total lbs lost since last in-office visit: 0   Interim History: Michele Douglas just started her last semester of grad school and is not focused on the plan.  She is not getting enough protein at dinner.  Subjective:   1. Vitamin D deficiency Michele Douglas's Vitamin D level was 37.6 on 03/07/2019. She is currently taking prescription vitamin D 50,000 IU each week. She denies nausea, vomiting or muscle weakness.  Assessment/Plan:   1. Vitamin D deficiency Low Vitamin D level contributes to fatigue and are associated with obesity, breast, and colon cancer. She agrees to continue to take prescription Vitamin D @50 ,000 IU every week and will follow-up for routine testing of Vitamin D, at least 2-3 times per year to avoid over-replacement.  2. Class 2 severe obesity with serious comorbidity and body mass index (BMI) of 38.0 to 38.9 in adult, unspecified obesity type [facility]) Michele Douglas is currently in the action stage of change. As such, her goal is to continue with weight loss efforts. She has agreed to the Category 2 Plan.   Protein substitution list was given.  Exercise goals: For substantial health benefits, adults should do at least 150 minutes (2 hours and 30 minutes) a week of moderate-intensity, or 75 minutes (1 hour and 15 minutes) a week of vigorous-intensity aerobic physical activity, or an equivalent combination of moderate- and vigorous-intensity aerobic activity. Aerobic activity should be  performed in episodes of at least 10 minutes, and preferably, it should be spread throughout the week.  Behavioral modification strategies: increasing lean protein intake and no skipping meals.  Michele Douglas has agreed to follow-up with our clinic in 3 weeks. She was informed of the importance of frequent follow-up visits to maximize her success with intensive lifestyle modifications for her multiple health conditions.   Objective:   Blood pressure 110/73, pulse 65, temperature 98.4 F (36.9 C), temperature source Oral, height 5\' 7"  (1.702 m), weight 246 lb (111.6 kg), last menstrual period 05/25/2019, SpO2 100 %. Body mass index is 38.53 kg/m.  General: Cooperative, alert, well developed, in no acute distress. HEENT: Conjunctivae and lids unremarkable. Cardiovascular: Regular rhythm.  Lungs: Normal work of breathing. Neurologic: No focal deficits.   Lab Results  Component Value Date   CREATININE 0.95 03/07/2019   BUN 8 03/07/2019   NA 138 03/07/2019   K 4.2 03/07/2019   CL 105 03/07/2019   CO2 21 03/07/2019   Lab Results  Component Value Date   ALT 7 03/07/2019   AST 13 03/07/2019   ALKPHOS 88 03/07/2019   BILITOT 0.7 03/07/2019   Lab Results  Component Value Date   HGBA1C 5.2 03/07/2019   HGBA1C 5.3 12/09/2017   Lab Results  Component Value Date   INSULIN 6.6 03/07/2019   Lab Results  Component Value Date   TSH 1.500 03/07/2019   Lab Results  Component Value Date   CHOL 140 03/07/2019   HDL 37 (L) 03/07/2019   LDLCALC 87 03/07/2019   TRIG 80  03/07/2019   CHOLHDL 2.9 09/06/2018   Lab Results  Component Value Date   WBC 8.0 09/06/2018   HGB 12.1 09/06/2018   HCT 35.9 09/06/2018   MCV 86.9 09/06/2018   PLT 392 09/06/2018   Attestation Statements:   Reviewed by clinician on day of visit: allergies, medications, problem list, medical history, surgical history, family history, social history, and previous encounter notes.  Time spent on visit including  pre-visit chart review and post-visit care and charting was 26 minutes.   I, Water quality scientist, CMA, am acting as Location manager for [employer], PA-C.  I have reviewed the above documentation for accuracy and completeness, and I agree with the above. Abby Potash, PA-C

## 2019-06-30 ENCOUNTER — Other Ambulatory Visit (INDEPENDENT_AMBULATORY_CARE_PROVIDER_SITE_OTHER): Payer: Self-pay | Admitting: Physician Assistant

## 2019-06-30 DIAGNOSIS — E559 Vitamin D deficiency, unspecified: Secondary | ICD-10-CM

## 2019-07-04 ENCOUNTER — Ambulatory Visit (INDEPENDENT_AMBULATORY_CARE_PROVIDER_SITE_OTHER): Payer: BC Managed Care – PPO | Admitting: Physician Assistant

## 2019-07-11 ENCOUNTER — Other Ambulatory Visit: Payer: Self-pay | Admitting: *Deleted

## 2019-07-11 MED ORDER — TOPIRAMATE 50 MG PO TABS: 50.0000 mg | ORAL_TABLET | Freq: Every day | ORAL | 1 refills | Status: AC

## 2019-08-08 ENCOUNTER — Telehealth: Payer: Self-pay | Admitting: Allergy

## 2019-08-08 NOTE — Telephone Encounter (Signed)
Letter generated and awaiting signature from Dr. Delorse Lek.

## 2019-08-08 NOTE — Telephone Encounter (Signed)
pateint called and said that she sent you a message in my chart , about a letter she needs as so as possible . Hr is wanting her to return to work

## 2019-08-08 NOTE — Telephone Encounter (Signed)
Please update her letter for work as follows: ---------------------------------------------  To whom it may concern:  Michele Douglas is under my care at the [facility].  She is being managed for asthma as well as allergic rhinitis with conjunctivitis.  Her asthma exacerbations have been triggered by noxious odors like strong perfumes, scented wall plugs, essential oils from diffusers, strong smelling cleaning supplies (i.e. disinfectant sprays).  She has been noted to have worsening asthma symptoms with inhalation of these different types of odors that has been encountered in her workplace.  Exposure to these types of odors may require use of rescue medications and/or seeking medical attention.  It is still my recommendation that she be allowed to work in an environment without the use of fragranced sprays or other products that may dispense odorous particles into the air.  It is also a recommendation that she not be exposed to strong smelling cleaning supplies within 30 minutes to an hour of use; this should help allow the odor to dissipate from the air. If you have any questions or concerns do not hesitate to contact my office.  Sincerely,    Margo Aye, MD [facility]

## 2019-08-09 NOTE — Telephone Encounter (Signed)
Called and spoke to patient and she has been informed that her letter is ready for pick up. She states she will not be able to come get it today and will send her dad to come by and get it. Letter placed up front for pick up.

## 2019-09-07 ENCOUNTER — Encounter: Payer: BC Managed Care – PPO | Admitting: Gynecology

## 2019-09-07 ENCOUNTER — Ambulatory Visit (INDEPENDENT_AMBULATORY_CARE_PROVIDER_SITE_OTHER): Payer: BC Managed Care – PPO | Admitting: Obstetrics and Gynecology

## 2019-09-07 ENCOUNTER — Encounter: Payer: Self-pay | Admitting: Obstetrics and Gynecology

## 2019-09-07 ENCOUNTER — Other Ambulatory Visit: Payer: Self-pay

## 2019-09-07 VITALS — BP 120/78 | Ht 67.0 in | Wt 257.0 lb

## 2019-09-07 DIAGNOSIS — N920 Excessive and frequent menstruation with regular cycle: Secondary | ICD-10-CM | POA: Diagnosis not present

## 2019-09-07 DIAGNOSIS — Z124 Encounter for screening for malignant neoplasm of cervix: Secondary | ICD-10-CM | POA: Diagnosis not present

## 2019-09-07 DIAGNOSIS — Z01419 Encounter for gynecological examination (general) (routine) without abnormal findings: Secondary | ICD-10-CM

## 2019-09-07 DIAGNOSIS — Z1151 Encounter for screening for human papillomavirus (HPV): Secondary | ICD-10-CM | POA: Diagnosis not present

## 2019-09-07 NOTE — Patient Instructions (Signed)
Recommend using ibuprofen 800 mg with food 3 times a day starting the day before expecting onset of period and continuing for a total of 5 days control of the heavy periods  If this is not improving the first few months then please let us know and we can try alternate options

## 2019-09-07 NOTE — Progress Notes (Signed)
Michele Douglas 12/17/1990 [id number]  SUBJECTIVE:  29 y.o. G0P0 female for annual routine gynecologic exam and Pap smear. Concerned about heavy periods.  Periods very heavy first 2 days, with severe cramping that is dehabilitating, ibuprofen 800 mg helps, also uses heating pads, uses cup because flow too heavy for use of pads.  The remainder of the 3 days her period is lighter.   Current Outpatient Medications  Medication Sig Dispense Refill  . albuterol (VENTOLIN HFA) 108 (90 Base) MCG/ACT inhaler INHALE 1-2 PUFFS INTO THE LUNGS EVERY 4 HOURS AS NEEDED 18 g 1  . azelastine (ASTELIN) 0.1 % nasal spray Place 2 sprays into both nostrils 2 (two) times daily. (Patient taking differently: Place 2 sprays into both nostrils as needed. ) 30 mL 5  . beclomethasone (QVAR REDIHALER) 80 MCG/ACT inhaler Inhale 2 puffs into the lungs 2 (two) times daily. 10.6 g 5  . fluticasone (FLONASE) 50 MCG/ACT nasal spray SHAKE LIQUID AND USE 2 SPRAYS IN EACH NOSTRIL DAILY 50 g 1  . ibuprofen (ADVIL) 800 MG tablet Take 1 tablet (800 mg total) by mouth every 8 (eight) hours as needed. 60 tablet 1  . levocetirizine (XYZAL) 5 MG tablet Take 1 tablet (5 mg total) by mouth as needed. 30 tablet 5  . meclizine (ANTIVERT) 25 MG tablet Take 1 tablet (25 mg total) by mouth 3 (three) times daily as needed for dizziness. 30 tablet 1  . montelukast (SINGULAIR) 10 MG tablet Take 1 tablet (10 mg total) by mouth at bedtime. 30 tablet 5  . Olopatadine HCl (PAZEO) 0.7 % SOLN Place 1 drop into both eyes daily. (Patient taking differently: Place 1 drop into both eyes as needed. ) 1 Bottle 5  . ondansetron (ZOFRAN) 4 MG tablet TAKE 1 TABLET(4 MG) BY MOUTH EVERY 8 HOURS AS NEEDED FOR NAUSEA OR VOMITING (Patient taking differently: Take 4 mg by mouth as needed. ) 30 tablet 0  . Peak Flow Meter (PEAK AIR PEAK FLOW METER) DEVI Use peak flow as instructed for asthma J45.41 1 each 0  . propranolol ER (INDERAL LA) 60 MG 24 hr capsule Take 60 mg by  mouth daily.  2  . topiramate (TOPAMAX) 50 MG tablet Take 1 tablet (50 mg total) by mouth daily. 90 tablet 1  . triamcinolone cream (KENALOG) 0.1 % APPLY EXTERNALLY TO THE AFFECTED AREA TWICE DAILY 30 g 0   No current facility-administered medications for this visit.   Allergies: Lactose and Diamox [acetazolamide]  Patient's last menstrual period was 08/17/2019.  Past medical history,surgical history, problem list, medications, allergies, family history and social history were all reviewed and documented as reviewed in the EPIC chart.  ROS:  Feeling well. No dyspnea or chest pain on exertion.  No abdominal pain, change in bowel habits, black or bloody stools.  No urinary tract symptoms. GYN ROS: Regular but heavy menses, no abnormal bleeding, pelvic pain or discharge, no breast pain or new or enlarging lumps on self exam. No neurological complaints.   OBJECTIVE:  BP 120/78   Ht 5\' 7"  (1.702 m)   Wt 257 lb (116.6 kg)   LMP 08/17/2019   BMI 40.25 kg/m  The patient appears well, alert, oriented x 3, in no distress. ENT normal.  Neck supple. No cervical or supraclavicular adenopathy or thyromegaly.  Lungs are clear, good air entry, no wheezes, rhonchi or rales. S1 and S2 normal, no murmurs, regular rate and rhythm.  Abdomen soft without tenderness, guarding, mass or organomegaly.  Neurological  is normal, no focal findings.  BREAST EXAM: breasts appear normal, no suspicious masses, no skin or nipple changes or axillary nodes  PELVIC EXAM: VULVA: normal appearing vulva with no masses, tenderness or lesions, VAGINA: normal appearing vagina with normal color and discharge, no lesions, CERVIX: normal appearing cervix without discharge or lesions, UTERUS: uterus is normal size, shape, consistency and nontender, ADNEXA: normal adnexa in size, nontender and no masses, PAP: Pap smear done today, thin-prep method  Chaperone: Kennon Portela present during the examination  ASSESSMENT:  29 y.o. G0P0  here for annual gynecologic exam  PLAN:   1.  Menorrhagia.  We discussed options to include scheduled ibuprofen for 5 days of menstrual cycle, Lysteda/TXA, or hormonal contraception.  She has never been on birth control before.  She will try using her ibuprofen in a scheduled fashion taking with food starting the day before expecting menses to begin and continuing for the 5 days.  Will follow up with [place] if continuing to have unacceptably heavy menses. 2. Pap smear 2018.  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  Follow-up Pap smear/HPV 2016 negative, pap smears 2017 and 2018 were negative.  Pap smear collected today. 3. Contraception. Not sexually active so declines need for contraception at this time. 4. Breast exam normal.  Encouraged self breast awareness. 5. Health maintenance.  Baseline labs completed 03/2019, notable for low HDL, following with her primary care doctor.   Return annually or sooner, prn.  Theresia Majors MD 09/07/19

## 2019-09-11 LAB — PAP IG AND HPV HIGH-RISK: HPV DNA High Risk: NOT DETECTED

## 2019-11-12 ENCOUNTER — Other Ambulatory Visit: Payer: Self-pay | Admitting: Allergy

## 2019-11-22 ENCOUNTER — Other Ambulatory Visit: Payer: Self-pay

## 2019-11-22 ENCOUNTER — Encounter: Payer: Self-pay | Admitting: Allergy

## 2019-11-22 ENCOUNTER — Ambulatory Visit: Payer: BC Managed Care – PPO | Admitting: Allergy

## 2019-11-22 VITALS — BP 110/84 | HR 65 | Temp 98.2°F | Resp 16

## 2019-11-22 DIAGNOSIS — H1013 Acute atopic conjunctivitis, bilateral: Secondary | ICD-10-CM

## 2019-11-22 DIAGNOSIS — L2089 Other atopic dermatitis: Secondary | ICD-10-CM | POA: Diagnosis not present

## 2019-11-22 DIAGNOSIS — J3089 Other allergic rhinitis: Secondary | ICD-10-CM | POA: Diagnosis not present

## 2019-11-22 DIAGNOSIS — T781XXD Other adverse food reactions, not elsewhere classified, subsequent encounter: Secondary | ICD-10-CM

## 2019-11-22 DIAGNOSIS — Z9109 Other allergy status, other than to drugs and biological substances: Secondary | ICD-10-CM

## 2019-11-22 DIAGNOSIS — J4540 Moderate persistent asthma, uncomplicated: Secondary | ICD-10-CM | POA: Diagnosis not present

## 2019-11-22 NOTE — Patient Instructions (Addendum)
Allergic rhinitis with conjunctivitis  - continue avoidance measures for grasses, weeds, trees, molds and dust mites.   - for itchy/watery/red eyes use OTC Alaway or Zaditor 1 drop each eye twice a day or Pataday 1 drop each eye daily as needed   - use nasal saline rinse daily at this time  - for nasal congestion/stuffiness recommend use of Nasacort or Flonase 2 sprays each nostril daily.  Use for 1-2 weeks at time before stopping once symptoms improve. If having nosebleeds decrease to 3 days a week use or stop completely.   -If having nasal drainage let us know and we can prescribe the Astelin for you  - continue Xyzal 5mg  daily   - continue singulair 10mg  daily  - allergen immunotherapy has been previously discussed including protocol, benefits and risk previously.   If medication management is ineffective then recommend consider course of immunotherapy.  Asthma, moderate persistent - continue Qvar redihaler to 2 puff twice a day.  - continue singulair 10mg  daily - have access to albuterol inhaler 2 puffs every 4-6 hours as needed for cough/wheeze/shortness of breath/chest tightness.  May use 15-20 minutes prior to activity.   Monitor frequency of use.    Asthma control goals:   Full participation in all desired activities (may need albuterol before activity)  Albuterol use two time or less a week on average (not counting use with activity)  Cough interfering with sleep two time or less a month  Oral steroids no more than once a year  No hospitalizations  Atopic dermatitis  - continue as needed use of triamcinolone with flares - daily moisturization with emollients like Vaseline, Aquafor, Eucerin, CeraVe  Nickel allergy   - avoid contact exposure to nickel based products  Adverse Food reaction  - testing to hazelnut is negative by skin and serum IgE testing.  It is most  PFAS as below.    - potentially could be pollen food allergy syndrome. The oral allergy syndrome (OAS)  or pollen-food allergy syndrome (PFAS) is a relatively common form of food allergy, particularly in adults. It typically occurs in people who have pollen allergies when the immune system "sees" proteins on the food that look like proteins on the pollen. This results in the allergy antibody (IgE) binding to the food instead of the pollen. Patients typically report itching and/or mild swelling of the mouth and throat immediately following ingestion of certain uncooked fruits (including nuts) or raw vegetables. Only a very small number of affected individuals experience systemic allergic reactions, such as anaphylaxis which occurs with true food allergies (see chart below - this is not an all encompassing chart).  Recommendation is to avoid the food to prevent the oral symptoms.    Follow-up 6 months or sooner if needed

## 2019-11-22 NOTE — Progress Notes (Signed)
Follow-up Note  RE: Michele Douglas MRN: [id number] DOB: [birth year] Date of Office Visit: 11/22/2019   History of present illness: Michele Douglas is a 29 y.o. 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  She was last seen in the office on 05/30/2019 by myself.  She states she has been doing well since her last visit without major complaints.  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  She thought the symptoms were related to the carpet as she does have a dust mite allergy.  Around the same time she also had a friend that tested positive for Covid so she was tested and states her rapid test was positive but her PCR was negative.  She did use her albuterol during this timeframe but has not required to use at any other time.  She continues on her Qvar and Singulair as well as her Xyzal.  She will use Flonase as needed for nasal symptoms.  She is not needed to use Astelin nor does she have access to it at this time.  She denies having any runny nose.  She continues to avoid hazelnut.  She is not had any significant issues with her eczema.  She does continue to have the episodic flare of her neck that does resolve with triamcinolone use.  Review of systems: Review of Systems  Constitutional: Negative.   HENT: Negative.   Eyes: Negative.   Respiratory: Negative.   Cardiovascular: Negative.   Gastrointestinal: Negative.   Musculoskeletal: Negative.   Skin: Negative.   Neurological: Negative.     All other systems negative unless noted above in HPI  Past medical/social/surgical/family history have been reviewed and are unchanged unless specifically indicated below.  No changes  Medication List: Current Outpatient Medications  Medication Sig Dispense Refill  . albuterol (VENTOLIN HFA)  108 (90 Base) MCG/ACT inhaler INHALE 1 TO 2 PUFFS INTO THE LUNGS EVERY 4 HOURS AS NEEDED 18 g 1  . beclomethasone (QVAR REDIHALER) 80 MCG/ACT inhaler Inhale 2 puffs into the lungs 2 (two) times daily. 10.6 g 5  . fluticasone (FLONASE) 50 MCG/ACT nasal spray SHAKE LIQUID AND USE 2 SPRAYS IN EACH NOSTRIL DAILY 50 g 1  . levocetirizine (XYZAL) 5 MG tablet Take 1 tablet (5 mg total) by mouth as needed. 30 tablet 5  . montelukast (SINGULAIR) 10 MG tablet Take 1 tablet (10 mg total) by mouth at bedtime. 30 tablet 5  . triamcinolone cream (KENALOG) 0.1 % APPLY EXTERNALLY TO THE AFFECTED AREA TWICE DAILY 30 g 0  . azelastine (ASTELIN) 0.1 % nasal spray Place 2 sprays into both nostrils 2 (two) times daily. (Patient taking differently: Place 2 sprays into both nostrils as needed. ) 30 mL 5  . ibuprofen (ADVIL) 800 MG tablet Take 1 tablet (800 mg total) by mouth every 8 (eight) hours as needed. 60 tablet 1  . meclizine (ANTIVERT) 25 MG tablet Take 1 tablet (25 mg total) by mouth 3 (three) times daily as needed for dizziness. 30 tablet 1  . Olopatadine HCl (PAZEO) 0.7 % SOLN Place 1 drop into both eyes daily. (Patient taking differently: Place 1 drop into both eyes as needed. ) 1 Bottle 5  . ondansetron (ZOFRAN) 4 MG tablet TAKE 1 TABLET(4 MG) BY MOUTH EVERY 8 HOURS AS NEEDED FOR NAUSEA OR VOMITING (Patient  taking differently: Take 4 mg by mouth as needed. ) 30 tablet 0  . Peak Flow Meter (PEAK AIR PEAK FLOW METER) DEVI Use peak flow as instructed for asthma J45.41 1 each 0  . propranolol ER (INDERAL LA) 60 MG 24 hr capsule Take 60 mg by mouth daily.  2  . topiramate (TOPAMAX) 50 MG tablet Take 1 tablet (50 mg total) by mouth daily. 90 tablet 1   No current facility-administered medications for this visit.     Known medication allergies: Allergies  Allergen Reactions  . Lactose Other (See Comments)    Nausea, vomiting, diarrhea.   . Diamox [Acetazolamide] Other (See Comments)    Tingling in fingers and  lips     Physical examination: Blood pressure 110/84, pulse 65, temperature 98.2 F (36.8 C), temperature source Temporal, resp. rate 16, SpO2 99 %.  General: Alert, interactive, in no acute distress. HEENT: PERRLA, TMs pearly gray, turbinates non-edematous without discharge, post-pharynx non erythematous. Neck: Supple without lymphadenopathy. Lungs: Clear to auscultation without wheezing, rhonchi or rales. {no increased work of breathing. CV: Normal S1, S2 without murmurs. Abdomen: Nondistended, nontender. Skin: Warm and dry, without lesions or rashes. Extremities:  No clubbing, cyanosis or edema. Neuro:   Grossly intact.  Diagnositics/Labs: None today  Assessment and plan:   Allergic rhinitis with conjunctivitis  - continue avoidance measures for grasses, weeds, trees, molds and dust mites.   - for itchy/watery/red eyes use OTC Alaway or Zaditor 1 drop each eye twice a day or Pataday 1 drop each eye daily as needed   - use nasal saline rinse daily at this time  - for nasal congestion/stuffiness recommend use of Nasacort or Flonase 2 sprays each nostril daily.  Use for 1-2 weeks at time before stopping once symptoms improve. If having nosebleeds decrease to 3 days a week use or stop completely.   -If having nasal drainage let us know and we can prescribe the Astelin for you  - continue Xyzal 5mg  daily   - continue singulair 10mg  daily  - allergen immunotherapy has been previously discussed including protocol, benefits and risk previously.   If medication management is ineffective then recommend consider course of immunotherapy.  Asthma, moderate persistent - continue Qvar redihaler to 2 puff twice a day.  - continue singulair 10mg  daily - have access to albuterol inhaler 2 puffs every 4-6 hours as needed for cough/wheeze/shortness of breath/chest tightness.  May use 15-20 minutes prior to activity.   Monitor frequency of use.    Asthma control goals:   Full  participation in all desired activities (may need albuterol before activity)  Albuterol use two time or less a week on average (not counting use with activity)  Cough interfering with sleep two time or less a month  Oral steroids no more than once a year  No hospitalizations  Atopic dermatitis  - continue as needed use of triamcinolone with flares - daily moisturization with emollients like Vaseline, Aquafor, Eucerin, CeraVe  Nickel allergy   - avoid contact exposure to nickel based products  Adverse Food reaction  - testing to hazelnut is negative by skin and serum IgE testing.  It is most  PFAS as below.    - potentially could be pollen food allergy syndrome. The oral allergy syndrome (OAS) or pollen-food allergy syndrome (PFAS) is a relatively common form of food allergy, particularly in adults. It typically occurs in people who have pollen allergies when the immune system "sees" proteins on the food  that look like proteins on the pollen. This results in the allergy antibody (IgE) binding to the food instead of the pollen. Patients typically report itching and/or mild swelling of the mouth and throat immediately following ingestion of certain uncooked fruits (including nuts) or raw vegetables. Only a very small number of affected individuals experience systemic allergic reactions, such as anaphylaxis which occurs with true food allergies (see chart below - this is not an all encompassing chart).  Recommendation is to avoid the food to prevent the oral symptoms.    Follow-up 6 months or sooner if needed  I appreciate the opportunity to take part in Synthia's care. Please do not hesitate to contact me with questions.  Sincerely,   Margo Aye, MD Allergy/Immunology [facility]

## 2019-12-16 ENCOUNTER — Other Ambulatory Visit: Payer: Self-pay | Admitting: Allergy

## 2020-03-11 ENCOUNTER — Telehealth: Payer: Self-pay

## 2020-03-11 NOTE — Telephone Encounter (Signed)
Attempted to call patient's pharmacy and they rudely advised me to contact the patient to get the PCN, BIN and Rx/number. Also attempted to call the patient and she mentioned to me that she doesn't have her insurance information.

## 2020-03-11 NOTE — Telephone Encounter (Signed)
Pharmacy called stating Qvar 80 needs a prior authorization in order to be covered. Pharmacy states Flovent is considered the best alternative with $0 co-pay.

## 2020-03-12 NOTE — Telephone Encounter (Signed)
Spoke with pharmacist Harriett Sine at [facility] who stated patient new insurance is [employer] and her member ID# is [id number] and PCN is [id number]. Pharmacist is going to fax over information from [email or web address] to imitate PA.

## 2020-03-12 NOTE — Telephone Encounter (Signed)
Request Reference Number: [id number]. QVAR REDIHA AER is approved through 03/12/2021. Your patient may now fill this prescription and it will be covered.

## 2020-03-12 NOTE — Telephone Encounter (Signed)
PA initiated through [email or web address] pending approval 

## 2020-07-03 ENCOUNTER — Ambulatory Visit: Payer: Self-pay | Admitting: Allergy

## 2020-09-19 ENCOUNTER — Encounter: Payer: BC Managed Care – PPO | Admitting: Obstetrics and Gynecology

## 2020-09-19 ENCOUNTER — Ambulatory Visit: Payer: Self-pay | Admitting: Obstetrics & Gynecology

## 2021-03-25 ENCOUNTER — Other Ambulatory Visit: Payer: Self-pay

## 2021-10-07 ENCOUNTER — Encounter (INDEPENDENT_AMBULATORY_CARE_PROVIDER_SITE_OTHER): Payer: Self-pay
